# Patient Record
Sex: Female | Born: 1985 | Hispanic: Yes | Marital: Married | Smoking: Never smoker
Health system: Southern US, Community
[De-identification: ages and names within clinical notes are randomized; demographics above are authoritative.]

## PROBLEM LIST (undated history)

## (undated) DIAGNOSIS — D649 Anemia, unspecified: Secondary | ICD-10-CM

## (undated) DIAGNOSIS — Z5189 Encounter for other specified aftercare: Secondary | ICD-10-CM

## (undated) DIAGNOSIS — R87629 Unspecified abnormal cytological findings in specimens from vagina: Secondary | ICD-10-CM

## (undated) DIAGNOSIS — F329 Major depressive disorder, single episode, unspecified: Secondary | ICD-10-CM

## (undated) DIAGNOSIS — N83209 Unspecified ovarian cyst, unspecified side: Secondary | ICD-10-CM

## (undated) DIAGNOSIS — F32A Depression, unspecified: Secondary | ICD-10-CM

## (undated) HISTORY — DX: Unspecified abnormal cytological findings in specimens from vagina: R87.629

## (undated) HISTORY — DX: Anemia, unspecified: D64.9

## (undated) HISTORY — DX: Depression, unspecified: F32.A

## (undated) HISTORY — DX: Encounter for other specified aftercare: Z51.89

---
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
## 2007-10-21 LAB — HM PAP SMEAR

## 2017-09-04 ENCOUNTER — Emergency Department
Admission: EM | Admit: 2017-09-04 | Discharge: 2017-09-04 | Disposition: A | Discharge status: Home / Self Care | Payer: Medicaid Other | Attending: Emergency Medicine | Admitting: Emergency Medicine

## 2017-09-04 ENCOUNTER — Emergency Department: Payer: Medicaid Other

## 2017-09-04 DIAGNOSIS — O200 Threatened abortion: Secondary | ICD-10-CM | POA: Insufficient documentation

## 2017-09-04 DIAGNOSIS — Z3A12 12 weeks gestation of pregnancy: Secondary | ICD-10-CM | POA: Insufficient documentation

## 2017-09-04 DIAGNOSIS — O4690 Antepartum hemorrhage, unspecified, unspecified trimester: Secondary | ICD-10-CM

## 2017-09-04 DIAGNOSIS — O4691 Antepartum hemorrhage, unspecified, first trimester: Secondary | ICD-10-CM | POA: Diagnosis present

## 2017-09-04 HISTORY — DX: Unspecified ovarian cyst, unspecified side: N83.209

## 2017-09-04 LAB — ABO/RH: ABO/RH(D): B POS

## 2017-09-04 LAB — HCG, QUANTITATIVE, PREGNANCY: hCG, Beta Chain, Quant, S: 18312 m[IU]/mL — ABNORMAL HIGH

## 2017-09-04 MED ORDER — IBUPROFEN 600 MG PO TABS
600.0000 mg | ORAL_TABLET | Freq: Once | ORAL | Status: AC
  Administered 2017-09-04: 600 mg via ORAL
  Filled 2017-09-04: qty 1

## 2017-09-04 NOTE — ED Provider Notes (Signed)
-----------------------------------------   3:18 PM on 09/04/2017 -----------------------------------------   Blood pressure 118/81, pulse 84, temperature 99.1 F (37.3 C), temperature source Oral, resp. rate 16, weight 61.2 kg (135 lb), last menstrual period 06/04/2017, SpO2 100 %.  Assuming care from Dr. Cyril LoosenKinner of Oneita JollyEleticia Rubye BeachMartinez Rivas is a 32 y.o. female with a chief complaint of Vaginal Bleeding .    Please refer to H&P by previous MD for further details.  The current plan of care is to f/u results of US and ABO.   _________________________ 6:19 PM on 09/04/2017 -----------------------------------------  Interpreted by me: US: IUP measuring 6w GA with no cardiac activity concerning for non viable pregnancy   Interpretation by Radiologist:  [place] Ob Comp Less 14 Wks  Result Date: 09/04/2017 CLINICAL DATA:  Vaginal bleeding. Estimated gestational age of [redacted] weeks, 1 day by LMP. EXAM: OBSTETRIC <14 WK US AND TRANSVAGINAL OB US TECHNIQUE: Both transabdominal and transvaginal ultrasound examinations were performed for complete evaluation of the gestation as well as the maternal uterus, adnexal regions, and pelvic cul-de-sac. Transvaginal technique was performed to assess early pregnancy. COMPARISON:  None. FINDINGS: Intrauterine gestational sac: Single Yolk sac:  Not Visualized. Embryo:  Visualized. Cardiac Activity: Not Visualized. CRL: 3.2 mm   6 w   0 d                  US EDC: 04/30/2018 Subchorionic hemorrhage:  None visualized. Maternal uterus/adnexae: Unremarkable. IMPRESSION: 1. Single intrauterine gestation with crown-rump length of 3.2 mm. No definite cardiac activity. Findings are suspicious but not yet definitive for failed pregnancy. Recommend follow-up US in 10-14 days for definitive diagnosis. This recommendation follows SRU consensus guidelines: Diagnostic Criteria for Nonviable Pregnancy Early in the First Trimester. Malva Limes Engl J Med 2013; 161:0960-45; 369:1443-51. Electronically Signed   By:  Obie DredgeWilliam T Derry M.D.   On: 09/04/2017 16:27   [place] Ob Transvaginal  Result Date: 09/04/2017 CLINICAL DATA:  Vaginal bleeding. Estimated gestational age of [redacted] weeks, 1 day by LMP. EXAM: OBSTETRIC <14 WK US AND TRANSVAGINAL OB US TECHNIQUE: Both transabdominal and transvaginal ultrasound examinations were performed for complete evaluation of the gestation as well as the maternal uterus, adnexal regions, and pelvic cul-de-sac. Transvaginal technique was performed to assess early pregnancy. COMPARISON:  None. FINDINGS: Intrauterine gestational sac: Single Yolk sac:  Not Visualized. Embryo:  Visualized. Cardiac Activity: Not Visualized. CRL: 3.2 mm   6 w   0 d                  US EDC: 04/30/2018 Subchorionic hemorrhage:  None visualized. Maternal uterus/adnexae: Unremarkable. IMPRESSION: 1. Single intrauterine gestation with crown-rump length of 3.2 mm. No definite cardiac activity. Findings are suspicious but not yet definitive for failed pregnancy. Recommend follow-up US in 10-14 days for definitive diagnosis. This recommendation follows SRU consensus guidelines: Diagnostic Criteria for Nonviable Pregnancy Early in the First Trimester. Malva Limes Engl J Med 2013; 409:8119-14; 369:1443-51. Electronically Signed   By: Obie DredgeWilliam T Derry M.D.   On: 09/04/2017 16:27     ABO B+ with no indication for [place]. Patient and family updated on results of the US and recommendation for f/u in 10-14 days for repeat US.       Don PerkingVeronese, [place], MD 09/04/17 [phone]

## 2017-09-04 NOTE — ED Provider Notes (Signed)
[facility] Emergency Department Provider Note   ____________________________________________    I have reviewed the triage vital signs and the nursing notes.   HISTORY  Chief Complaint Vaginal Bleeding     HPI Donna Barrett is a 32 y.o. female who presents with vaginal bleeding.  Patient reports she is a G3 P2 with 2 prior vaginal deliveries without difficulties during her first 2 pregnancies.  Patient reports she believes she is about 3 months pregnant, has not had ultrasound her OB visit yet.  Developed small amount of vaginal bleeding yesterday.  Denies abdominal pain or cramping.  No dysuria.  No fevers or chills.  Past Medical History:  Diagnosis Date  . Ovarian cyst     There are no active problems to display for this patient.   History reviewed. No pertinent surgical history.  Prior to Admission medications   Not on File     Allergies Patient has no known allergies.  No family history on file.  Social History No smoking, no alcohol  Review of Systems  Constitutional: No fever/chills Eyes: No visual changes.  ENT: No sore throat. Cardiovascular: No palpitations Respiratory: No cough Gastrointestinal: No abdominal pain.   Genitourinary: Vaginal bleeding as above Musculoskeletal: Negative for back pain. Skin: Negative for rash. Neurological: Negative for headaches    ____________________________________________   PHYSICAL EXAM:  VITAL SIGNS: ED Triage Vitals  Enc Vitals Group     BP 09/04/17 1324 107/87     Pulse Rate 09/04/17 1324 95     Resp 09/04/17 1324 18     Temp 09/04/17 1324 99.1 F (37.3 C)     Temp Source 09/04/17 1324 Oral     SpO2 09/04/17 1324 99 %     Weight 09/04/17 1325 61.2 kg (135 lb)     Height --      Head Circumference --      Peak Flow --      Pain Score 09/04/17 1325 7     Pain Loc --      Pain Edu? --      Excl. in GC? --     Constitutional: Alert and oriented. No acute  distress.  Eyes: Conjunctivae are normal.   Nose: No congestion/rhinnorhea. Mouth/Throat: Mucous membranes are moist.    Cardiovascular: Normal rate, regular rhythm. Grossly normal heart sounds.  Good peripheral circulation. Respiratory: Normal respiratory effort.  No retractions. Gastrointestinal: Soft and nontender. No distention.   Genitourinary: deferred Musculoskeletal:  Warm and well perfused Neurologic:  Normal speech and language. No gross focal neurologic deficits are appreciated.  Skin:  Skin is warm, dry and intact. No rash noted. Psychiatric: Mood and affect are normal. Speech and behavior are normal.  ____________________________________________   LABS (all labs ordered are listed, but only abnormal results are displayed)  Labs Reviewed  HCG, QUANTITATIVE, PREGNANCY - Abnormal; Notable for the following components:      Result Value   hCG, Beta Chain, Quant, S 18,312 (*)    All other components within normal limits  CBC  ABO/RH   ____________________________________________  EKG  None ____________________________________________  RADIOLOGY  Ultrasound pending ____________________________________________   PROCEDURES  Procedure(s) performed: No  Procedures   Critical Care performed: No ____________________________________________   INITIAL IMPRESSION / ASSESSMENT AND PLAN / ED COURSE  Pertinent labs & imaging results that were available during my care of the patient were reviewed by me and considered in my medical decision making (see chart for details).  Patient  presents with vaginal bleeding, she believes she is about [redacted] weeks pregnant.  No significant abdominal cramping.  HCG is 18,000, ABO Rh pending, ultrasound pending  Will ask my colleague to follow up on abo/rh and US results    ____________________________________________   FINAL CLINICAL IMPRESSION(S) / ED DIAGNOSES  Final diagnoses:  Vaginal bleeding in pregnancy         Note:  This document was prepared using Dragon voice recognition software and may include unintentional dictation errors.    Jene EveryKinner, Wilferd Ritson, MD 09/04/17 [phone]

## 2017-09-04 NOTE — ED Triage Notes (Signed)
Interpreter present.  Pt reports having some brownish light bleeding yesterday.  Pt reports that she is approximately 3 months pregnant.  Pt states that the clinic told her to come to ER if she continued to bleed.  Pt reports that she is being seen through [facility] for [facility].  Pt denies having an ultrasound as of yet.  Pt is A&Ox4, in NAD at this time.

## 2017-09-04 NOTE — ED Notes (Signed)
Collected Lav top, sent to lab pending orders.

## 2017-09-04 NOTE — ED Notes (Signed)
Pt 3months pregnant, c/o vaginal bleeding since yesterday

## 2018-01-25 ENCOUNTER — Other Ambulatory Visit: Payer: Self-pay

## 2018-01-25 ENCOUNTER — Ambulatory Visit
Admission: EM | Admit: 2018-01-25 | Discharge: 2018-01-25 | Disposition: A | Discharge status: Home / Self Care | Payer: Self-pay | Attending: Internal Medicine | Admitting: Internal Medicine

## 2018-01-25 ENCOUNTER — Encounter: Payer: Self-pay | Admitting: Emergency Medicine

## 2018-01-25 DIAGNOSIS — R51 Headache: Secondary | ICD-10-CM

## 2018-01-25 DIAGNOSIS — R1110 Vomiting, unspecified: Secondary | ICD-10-CM

## 2018-01-25 DIAGNOSIS — R42 Dizziness and giddiness: Secondary | ICD-10-CM

## 2018-01-25 DIAGNOSIS — R519 Headache, unspecified: Secondary | ICD-10-CM

## 2018-01-25 LAB — URINALYSIS, COMPLETE (UACMP) WITH MICROSCOPIC
BILIRUBIN URINE: NEGATIVE
Glucose, UA: NEGATIVE mg/dL
HGB URINE DIPSTICK: NEGATIVE
Ketones, ur: NEGATIVE mg/dL
Leukocytes, UA: NEGATIVE
Nitrite: NEGATIVE
PH: 7 (ref 5.0–8.0)
Protein, ur: NEGATIVE mg/dL
SPECIFIC GRAVITY, URINE: 1.015 (ref 1.005–1.030)

## 2018-01-25 MED ORDER — PREDNISONE 50 MG PO TABS: 50.0000 mg | ORAL_TABLET | Freq: Every day | ORAL | 0 refills | Status: AC

## 2018-01-25 MED ORDER — ONDANSETRON HCL 4 MG PO TABS: 8.0000 mg | ORAL_TABLET | ORAL | 0 refills | Status: DC | PRN

## 2018-01-25 MED ORDER — SUMATRIPTAN SUCCINATE 6 MG/0.5ML ~~LOC~~ SOLN
6.0000 mg | Freq: Once | SUBCUTANEOUS | Status: AC
  Administered 2018-01-25: 6 mg via SUBCUTANEOUS

## 2018-01-25 MED ORDER — KETOROLAC TROMETHAMINE 60 MG/2ML IM SOLN
60.0000 mg | Freq: Once | INTRAMUSCULAR | Status: AC
  Administered 2018-01-25: 60 mg via INTRAMUSCULAR

## 2018-01-25 MED ORDER — ONDANSETRON HCL 4 MG/2ML IJ SOLN
8.0000 mg | Freq: Once | INTRAMUSCULAR | Status: AC
  Administered 2018-01-25: 8 mg via INTRAMUSCULAR

## 2018-01-25 NOTE — ED Notes (Signed)
[employer] Interpreter [phone] used during triage.

## 2018-01-25 NOTE — ED Triage Notes (Addendum)
Patient c/o vomiting, dizziness, blurry vision and HA that started about 2 hours ago.  Patient states that she did have a miscariage 3 months ago.   Patient also reports that she has 4 insect bites on her body 2 days ago.

## 2018-01-25 NOTE — Discharge Instructions (Signed)
Urine test today was negative for protein.  Injections of ketorolac (anti inflammatory/pain reliever) and sumatriptan (migraine medicine) were given at the urgent care today, for headache.  An injection of ondansetron was given for vomiting.  Prescriptions for ondansetron (for nausea), and prednisone (steroid, to try and break headache), were sent to the pharmacy.  Rest and push fluids.  Follow-up with your primary care provider to discuss whether preventive medicine or another headache management strategy would be helpful in decreasing frequency/severity of headaches.

## 2018-01-25 NOTE — ED Provider Notes (Signed)
[facility]    CSN: [id number] Arrival date & time: 01/25/18  1142     History   Chief Complaint Chief Complaint  Patient presents with  . Headache  . Dizziness  . Emesis    HPI Donna Barrett is a 32 y.o. female.   She presents today with headache that started about 3 hours ago, with vomiting.  Headache was preceded by a little bit of blurry vision and some dizziness.  She gets a bad headache about once a month, might last up to a week.  She gets lesser headaches 1-2 times a week that last less than a day.  She takes a lot of ibuprofen which helps a little bit. Current headache is frontal radiating to both temples.  Describes the pain is a strong and heavy pain. This headache is similar to previous headaches.  She rates her current headache as 9/10.   She has nausea, stomach hurts a little bit in the epigastric area.  No leg swelling.  Does have tiredness.  Reports a miscarriage 13 weeks ago, has not had postnatal care for this but did present to ED (chart review: 08/2017) Has a family history of headaches, her mother and grandmother have them.    HPI  Past Medical History:  Diagnosis Date  . Ovarian cyst     History reviewed. No pertinent surgical history.  OB History    Gravida  1   Para      Term      Preterm      AB      Living        SAB      TAB      Ectopic      Multiple      Live Births               Home Medications   Takes ibuprofen prn for headache  Family History See HPI  Social History Social History   Tobacco Use  . Smoking status: Never Smoker  . Smokeless tobacco: Never Used  Substance Use Topics  . Alcohol use: Never    Frequency: Never  . Drug use: Never     Allergies   Patient has no known allergies.   Review of Systems Review of Systems  All other systems reviewed and are negative.    Physical Exam Triage Vital Signs ED Triage Vitals  Enc Vitals Group     BP 01/25/18 1218 117/88       Pulse Rate 01/25/18 1218 83     Resp 01/25/18 1218 14     Temp 01/25/18 1218 98.1 F (36.7 C)     Temp Source 01/25/18 1218 Oral     SpO2 01/25/18 1218 100 %     Weight 01/25/18 1210 138 lb (62.6 kg)     Height 01/25/18 1210 5\' 1"  (1.549 m)     Pain Score 01/25/18 1210 8     Pain Loc --    Updated Vital Signs BP 117/88 (BP Location: Right Arm)   Pulse 83   Temp 98.1 F (36.7 C) (Oral)   Resp 14   Ht 5\' 1"  (1.549 m)   Wt 138 lb (62.6 kg)   LMP 01/12/2018 (Exact Date)   SpO2 100%   Breastfeeding? No   BMI 26.07 kg/m  Physical Exam  Constitutional: She is oriented to person, place, and time. No distress.  Alert, nicely groomed  HENT:  Head: Atraumatic.  Left TM is  moderately dull, red-tinged Right TM is translucent, no erythema Moderate nasal congestion bilaterally Tongue is midline  Eyes: Pupils are equal, round, and reactive to light.  Conjugate gaze, no eye redness/drainage  Neck: Neck supple.  Cardiovascular: Normal rate and regular rhythm.  Pulmonary/Chest: Effort normal. No respiratory distress. She has no wheezes. She exhibits no tenderness.  Lungs clear, symmetric breath sounds  Abdominal: She exhibits no distension.  Musculoskeletal: Normal range of motion. She exhibits no edema.  No leg swelling  Neurological: She is alert and oriented to person, place, and time.  Face is symmetric, speech is clear/coherent spanish language translator is used  Skin: Skin is warm and dry. No rash noted.  No cyanosis  Nursing note and vitals reviewed.    UC Treatments / Results  Labs Results for orders placed or performed during the hospital encounter of 01/25/18  Urinalysis, Complete w Microscopic  Result Value Ref Range   Color, Urine STRAW (A) YELLOW   APPearance HAZY (A) CLEAR   Specific Gravity, Urine 1.015 1.005 - 1.030   pH 7.0 5.0 - 8.0   Glucose, UA NEGATIVE NEGATIVE mg/dL   Hgb urine dipstick NEGATIVE NEGATIVE   Bilirubin Urine NEGATIVE NEGATIVE    Ketones, ur NEGATIVE NEGATIVE mg/dL   Protein, ur NEGATIVE NEGATIVE mg/dL   Nitrite NEGATIVE NEGATIVE   Leukocytes, UA NEGATIVE NEGATIVE   Squamous Epithelial / LPF 21-50 0 - 5   WBC, UA 0-5 0 - 5 WBC/hpf   RBC / HPF 0-5 0 - 5 RBC/hpf   Bacteria, UA RARE (A) NONE SEEN    EKG None  Radiology No results found.  Procedures Procedures (including critical care time)  Medications Ordered in UC Medications  ondansetron (ZOFRAN) injection 8 mg (8 mg Intramuscular Given 01/25/18 1231)  ketorolac (TORADOL) injection 60 mg (60 mg Intramuscular Given 01/25/18 1258)  SUMAtriptan (IMITREX) injection 6 mg (6 mg Subcutaneous Given 01/25/18 1255)   Headache decreased from 9/10 to 2/10 at time of discharge.    Final Clinical Impressions(s) / UC Diagnoses   Final diagnoses:  Bad headache     Discharge Instructions     Urine test today was negative for protein.  Injections of ketorolac (anti inflammatory/pain reliever) and sumatriptan (migraine medicine) were given at the urgent care today, for headache.  An injection of ondansetron was given for vomiting.  Prescriptions for ondansetron (for nausea), and prednisone (steroid, to try and break headache), were sent to the pharmacy.  Rest and push fluids.  Follow-up with your primary care provider to discuss whether preventive medicine or another headache management strategy would be helpful in decreasing frequency/severity of headaches.      ED Prescriptions    Medication Sig Dispense Auth. Provider   predniSONE (DELTASONE) 50 MG tablet Take 1 tablet (50 mg total) by mouth daily for 5 days. 5 tablet Isa RankinMurray, Ashara Lounsbury Wilson, MD   ondansetron [facility]ZOFRAN) 4 MG tablet Take 2 tablets (8 mg total) by mouth every 4 (four) hours as needed for nausea or vomiting. 20 tablet Isa RankinMurray, Corion Sherrod Wilson, MD        Isa RankinMurray, Nolen Lindamood Wilson, MD 01/29/18 [phone]

## 2018-12-16 ENCOUNTER — Other Ambulatory Visit: Payer: Self-pay | Admitting: Family Medicine

## 2018-12-16 DIAGNOSIS — N644 Mastodynia: Secondary | ICD-10-CM

## 2018-12-25 ENCOUNTER — Other Ambulatory Visit: Payer: Self-pay | Admitting: Internal Medicine

## 2018-12-25 DIAGNOSIS — N643 Galactorrhea not associated with childbirth: Secondary | ICD-10-CM

## 2018-12-25 DIAGNOSIS — N644 Mastodynia: Secondary | ICD-10-CM

## 2019-01-06 ENCOUNTER — Ambulatory Visit: Payer: Self-pay | Attending: Oncology | Admitting: *Deleted

## 2019-01-06 ENCOUNTER — Ambulatory Visit
Admission: RE | Admit: 2019-01-06 | Discharge: 2019-01-06 | Disposition: A | Discharge status: Home / Self Care | Payer: Self-pay | Source: Ambulatory Visit | Attending: Internal Medicine | Admitting: Internal Medicine

## 2019-01-06 ENCOUNTER — Other Ambulatory Visit: Payer: Self-pay

## 2019-01-06 ENCOUNTER — Ambulatory Visit
Admission: RE | Admit: 2019-01-06 | Discharge: 2019-01-06 | Disposition: A | Discharge status: Home / Self Care | Payer: Self-pay | Source: Ambulatory Visit | Attending: Family Medicine | Admitting: Family Medicine

## 2019-01-06 ENCOUNTER — Encounter: Payer: Self-pay | Admitting: *Deleted

## 2019-01-06 VITALS — BP 109/77 | HR 82 | Temp 97.7°F | Ht 63.0 in | Wt 133.8 lb

## 2019-01-06 DIAGNOSIS — N644 Mastodynia: Secondary | ICD-10-CM

## 2019-01-06 DIAGNOSIS — N643 Galactorrhea not associated with childbirth: Secondary | ICD-10-CM

## 2019-01-06 NOTE — Progress Notes (Signed)
  Subjective:     Patient ID: Donna Barrett, female   DOB: [birth year], 32 y.o.   MRN: [id number]  HPI   Review of Systems     Objective:   Physical Exam Neck:     Thyroid: Thyromegaly present.  Chest:     Breasts:        Right: Nipple discharge and tenderness present. No swelling, bleeding, inverted nipple, mass or skin change.        Left: Nipple discharge and tenderness present. No swelling, bleeding, inverted nipple, mass or skin change.    Lymphadenopathy:     Upper Body:     Right upper body: No supraclavicular or axillary adenopathy.     Left upper body: No supraclavicular or axillary adenopathy.        Assessment:     33 year old Hispanic female referred to [place] by the [facility] for bilateral breast pain and nipple discharge.  Jacqui, the interpreter present during the interview and exam.  Patient states she started have cyclic breast tenderness about 1 year ago after she had a miscarriage.  States the pain is bilateral and goes away after her menstrual cycle.  Also states she has "occassional yellow" bilateral nipple discharge on expression only.  Reviewed pathology of normal cyclic breast pain.  She was encouraged not to express any discharge.  Patient was noted to have an enlarged palpable thyroid on physical exam.  She states increased fatigue and intolerance to cold temperatures.  She was encouraged to call her primary care at the [facility] to schedule an appointment for further evaluation of her thyroid.  She is agreeable.   Patient has been screened for eligibility.  She does not have any insurance, Medicare or Medicaid.  She also meets financial eligibility.  Hand-out given on the Affordable Care Act.    Plan:     Will go ahead and get bilateral diagnostic mammogram and ultrasound through our [facility].  Will follow-up per [facility] protocol.

## 2019-01-06 NOTE — Progress Notes (Signed)
Patient with normal mammogram and ultrasound.  Since benign findings on clinical exam will defer back to primary care for any further needs.  HSIS to [place].

## 2019-03-18 ENCOUNTER — Ambulatory Visit (LOCAL_COMMUNITY_HEALTH_CENTER): Payer: Self-pay

## 2019-03-18 ENCOUNTER — Other Ambulatory Visit: Payer: Self-pay

## 2019-03-18 VITALS — BP 108/76 | Ht 62.0 in | Wt 138.0 lb

## 2019-03-18 DIAGNOSIS — Z3201 Encounter for pregnancy test, result positive: Secondary | ICD-10-CM

## 2019-03-18 LAB — PREGNANCY, URINE: Preg Test, Ur: POSITIVE — AB

## 2019-03-18 MED ORDER — PRENATAL VITAMIN 27-0.8 MG PO TABS: 1.0000 | ORAL_TABLET | Freq: Every day | ORAL | 0 refills | Status: AC

## 2019-03-18 NOTE — Progress Notes (Signed)
Pt desires prenatal care at [facility]; sent to preadmit. EGA 8.2 weeks and EDC 10/26/2019 based on normal LMP of 01/19/2019.

## 2019-04-04 NOTE — Progress Notes (Signed)
Abstraction per phone interview 04/01/19 with Tawanna Solo, RN ; Debera Lat, RN

## 2019-04-08 ENCOUNTER — Telehealth: Payer: Self-pay

## 2019-04-08 NOTE — Telephone Encounter (Signed)
Attempted to call to change appt. Due to provider out today; left message via interpreter Debera Lat, RN

## 2019-04-11 ENCOUNTER — Other Ambulatory Visit: Payer: Self-pay

## 2019-04-11 ENCOUNTER — Encounter: Payer: Self-pay | Admitting: Family Medicine

## 2019-04-11 ENCOUNTER — Ambulatory Visit: Payer: Medicaid Other | Admitting: Family Medicine

## 2019-04-11 VITALS — BP 114/79 | Temp 98.3°F | Wt 136.6 lb

## 2019-04-11 DIAGNOSIS — O26849 Uterine size-date discrepancy, unspecified trimester: Secondary | ICD-10-CM

## 2019-04-11 DIAGNOSIS — Z3480 Encounter for supervision of other normal pregnancy, unspecified trimester: Secondary | ICD-10-CM | POA: Insufficient documentation

## 2019-04-11 DIAGNOSIS — O09299 Supervision of pregnancy with other poor reproductive or obstetric history, unspecified trimester: Secondary | ICD-10-CM | POA: Diagnosis not present

## 2019-04-11 LAB — HEMOGLOBIN, FINGERSTICK: Hemoglobin: 13 g/dL (ref 11.1–15.9)

## 2019-04-11 LAB — URINALYSIS
Bilirubin, UA: NEGATIVE
Glucose, UA: NEGATIVE
Ketones, UA: NEGATIVE
Leukocytes,UA: NEGATIVE
Nitrite, UA: NEGATIVE
Protein,UA: NEGATIVE
RBC, UA: NEGATIVE
Specific Gravity, UA: 1.015 (ref 1.005–1.030)
Urobilinogen, Ur: 0.2 mg/dL (ref 0.2–1.0)
pH, UA: 7 (ref 5.0–7.5)

## 2019-04-11 LAB — OB RESULTS CONSOLE HIV ANTIBODY (ROUTINE TESTING): HIV: NONREACTIVE

## 2019-04-11 NOTE — Progress Notes (Signed)
[facility] [address] [place] [address] [place] [id number] [phone]  INITIAL PRENATAL VISIT NOTE  Subjective:  Donna Barrett is a 33 y.o. [id number] at 4519w5d being seen today to start prenatal care [facility].  She is currently monitored for the following issues for this low-risk pregnancy and has Supervision of other normal pregnancy, antepartum and History of postpartum hemorrhage, currently pregnant on their problem list.  Patient reports 1) puritic rash- started about 1 week ago. Using calomine which helps.   2) pain in lower abdomen- left side. Describes are stretching  [facility] Contractions: Not present. Vag. Bleeding: None.  Movement: Present. Denies leaking of fluid.   The following portions of the patient's history were reviewed and updated as appropriate: allergies, current medications, past family history, past medical history, past social history, past surgical history and problem list. Problem list updated.  Objective:   Vitals:   04/11/19 0848  BP: 114/79  Temp: 98.3 F (36.8 C)  Weight: 136 lb 9.6 oz (62 kg)    Fetal Status: Fetal Heart Rate (bpm): 140 Fundal Height: 14 cm Movement: Present     Physical Exam Vitals signs and nursing note reviewed.  Constitutional:      General: She is not in acute distress.    Appearance: Normal appearance. She is well-developed.  HENT:     Head: Normocephalic and atraumatic.     Mouth/Throat:     Lips: Pink.     Mouth: Mucous membranes are moist.     Dentition: Normal dentition. No dental caries.  Neck:     Thyroid: No thyroid mass or thyromegaly.  Cardiovascular:     Rate and Rhythm: Normal rate.  Pulmonary:     Effort: Pulmonary effort is normal.     Breath sounds: Normal breath sounds.  Chest:     Breasts: Breasts are symmetrical.        Right: Normal. No mass, nipple  discharge or skin change.        Left: Normal. No mass, nipple discharge or skin change.  Abdominal:     General: Abdomen is flat.     Palpations: Abdomen is soft.     Tenderness: There is no abdominal tenderness.     Comments: Gravid   Genitourinary:    General: Normal vulva.     Exam position: Lithotomy position.     Pubic Area: No rash.      Labia:        Right: No rash.        Left: No rash.      Vagina: Normal. No vaginal discharge.     Cervix: No cervical motion tenderness or friability.     Uterus: Normal. Enlarged (Gravid 14-15 size ). Not tender.      Adnexa: Right adnexa normal and left adnexa normal.     Rectum: Normal. No external hemorrhoid.  Lymphadenopathy:     Upper Body:     Right upper body: No axillary adenopathy.     Left upper body: No axillary adenopathy.  Skin:    General: Skin is warm.     Capillary Refill: Capillary refill takes less than 2 seconds.  Neurological:     Mental Status: She is alert.     Assessment and Plan:  Pregnancy: [id number] at 5519w5d  1. Supervision of other normal pregnancy, antepartum Patient was seen for IP visit  today  Counseled on model of care at [place] with CNM, FNP and MD team and delivery services provided by local OB/GYN offices at [facility] and by [place] at [facility].  Reviewed Centering pregnancy as the standard of care here at [place] currently on hold due to [place] 19  Patient received nutritional counseling and was referred to [facility] We discussed genetic screening tests and the patient desires FIRST screening/early [place] but given FH suspect more advanced GA. Ordered dating [place].   Reviewed timing of visits and standard [place] at 18-20 weeks   - HIV [place] [facility] - Urine Culture - Chlamydia/GC NAA, Confirmation - Prenatal profile without Varicella or Rubella - Hemoglobin, venipuncture  2. History of postpartum hemorrhage, currently pregnant LD aware   3. Size- dates: suspect patient is farther in gestational age.  Plan for dating [place] ASAP.   Preterm labor symptoms and general obstetric precautions including but not limited to vaginal bleeding, contractions, leaking of fluid and fetal movement were reviewed in detail with the patient.  Please refer to After Visit Summary for other counseling recommendations.   Return in about 4 weeks (around 05/09/2019) for Routine prenatal care, Telehealth/Virtual health OB Visit.  No future appointments.  Caren Macadam, MD

## 2019-04-11 NOTE — Progress Notes (Signed)
Patient here for new OB at about 11 5/7 weeks. States she went to the hospital once during this pregnancy for migraines, about 1 month ago. Patient states she had last Pap test last year at [facility], and denies any abnormal paps. Patient verifies first name as Elya.Jenetta Downer, RN

## 2019-04-11 NOTE — Progress Notes (Signed)
Hgb and urine dip reviewed, no treatment indicated. [facility] U/S referral faxed with confirmation and patient counseled to expect call from [facility] to schedule. Anatomy U/S referral on nurse to do cart to be faxed and scheduled after 1st U/S completed. Patient scheduled for next RV in clinic, per Dr. Ernestina Patches request, for 05/09/2019, patient given reminder card.Jenetta Downer, RN

## 2019-04-12 LAB — CBC/D/PLT+RPR+RH+ABO+AB SCR
Antibody Screen: NEGATIVE
Basophils Absolute: 0 10*3/uL (ref 0.0–0.2)
Basos: 0 %
EOS (ABSOLUTE): 0.2 10*3/uL (ref 0.0–0.4)
Eos: 2 %
Hematocrit: 39.4 % (ref 34.0–46.6)
Hemoglobin: 13.3 g/dL (ref 11.1–15.9)
Hepatitis B Surface Ag: NEGATIVE
Immature Grans (Abs): 0.1 10*3/uL (ref 0.0–0.1)
Immature Granulocytes: 1 %
Lymphocytes Absolute: 2.1 10*3/uL (ref 0.7–3.1)
Lymphs: 19 %
MCH: 29.9 pg (ref 26.6–33.0)
MCHC: 33.8 g/dL (ref 31.5–35.7)
MCV: 89 fL (ref 79–97)
Monocytes Absolute: 0.7 10*3/uL (ref 0.1–0.9)
Monocytes: 7 %
Neutrophils Absolute: 8 10*3/uL — ABNORMAL HIGH (ref 1.4–7.0)
Neutrophils: 71 %
Platelets: 220 10*3/uL (ref 150–450)
RBC: 4.45 x10E6/uL (ref 3.77–5.28)
RDW: 14.4 % (ref 11.7–15.4)
RPR Ser Ql: NONREACTIVE
Rh Factor: POSITIVE
WBC: 11.1 10*3/uL — ABNORMAL HIGH (ref 3.4–10.8)

## 2019-04-13 LAB — URINE CULTURE

## 2019-04-15 LAB — CHLAMYDIA/GC NAA, CONFIRMATION
Chlamydia trachomatis, NAA: NEGATIVE
Neisseria gonorrhoeae, NAA: NEGATIVE

## 2019-04-22 ENCOUNTER — Telehealth: Payer: Self-pay

## 2019-04-22 NOTE — Telephone Encounter (Signed)
Returned call-informed of above via clerical Debera Lat, Therapist, sports

## 2019-04-22 NOTE — Telephone Encounter (Signed)
Per [facility], name of Donna Barrett, Donna Barrett (DOB 1986/07/26, same address and phone # as in [place]) has MRN = [id number].   Client also in [facility] as Donna Barrett, Donna Barrett (DOB and phone # same as at [place]) and  MRN = [id number]. This name is where can find  04/18/2019 [place] report today.  Client has [facility] anatomy US referral with name of Donna Barrett, Donna Barrett. Per Altha Harm in [facility] OB US scheduling, she will merge client charts at [facility] to name as appears on [facility] and MRN = [id number].  Phone call to client to notify her of above and that [facility] will be calling her with [place] appt. [facility] with number to call provided. Language Line used (ID # [phone]).  Rich Number, RN

## 2019-05-09 ENCOUNTER — Other Ambulatory Visit: Payer: Self-pay

## 2019-05-09 ENCOUNTER — Ambulatory Visit: Payer: Medicaid Other | Admitting: Advanced Practice Midwife

## 2019-05-09 DIAGNOSIS — Z3480 Encounter for supervision of other normal pregnancy, unspecified trimester: Secondary | ICD-10-CM

## 2019-05-09 NOTE — Progress Notes (Signed)
   PRENATAL VISIT NOTE  Subjective:  Donna Barrett is a 33 y.o. [phone] at [redacted]w[redacted]d being seen today for ongoing prenatal care.  She is currently monitored for the following issues for this low-risk pregnancy and has Supervision of other normal pregnancy, antepartum and History of postpartum hemorrhage, currently pregnant on their problem list.  Patient reports no complaints.  Contractions: Not present. Vag. Bleeding: None.  Movement: Present. Denies leaking of fluid/ROM.   The following portions of the patient's history were reviewed and updated as appropriate: allergies, current medications, past family history, past medical history, past social history, past surgical history and problem list. Problem list updated.  Objective:   Vitals:   05/09/19 1513  BP: 106/68  Temp: 97.8 F (36.6 C)  Weight: 142 lb 12.8 oz (64.8 kg)    Fetal Status: Fetal Heart Rate (bpm): 130 Fundal Height: 17 cm Movement: Present     General:  Alert, oriented and cooperative. Patient is in no acute distress.  Skin: Skin is warm and dry. No rash noted.   Cardiovascular: Normal heart rate noted  Respiratory: Normal respiratory effort, no problems with respiration noted  Abdomen: Soft, gravid, appropriate for gestational age.  Pain/Pressure: Present     Pelvic: Cervical exam deferred        Extremities: Normal range of motion.  Edema: None  Mental Status: Normal mood and affect. Normal behavior. Normal judgment and thought content.   Assessment and Plan:  Pregnancy: [id number] at [redacted]w[redacted]d  1. Supervision of other normal pregnancy, antepartum Desires Quad screen.  Declines flu vax today.  Not employed.  Pt reminded of 05/30/19 anatomy scan Needs ROI for pap 2019 [place] Only   Preterm labor symptoms and general obstetric precautions including but not limited to vaginal bleeding, contractions, leaking of fluid and fetal movement were reviewed in detail with the patient. Please  refer to After Visit Summary for other counseling recommendations.  No follow-ups on file.  No future appointments.  Herbie Saxon, CNM

## 2019-05-09 NOTE — Progress Notes (Signed)
Patient here for prenatal visit at 17 5/7. Patient aware of [facility] U/S on 05/30/2019. Patient wants Quad screen today. Patient unsure about flu vaccine and wants to think about it for next visit.Jenetta Downer, RN

## 2019-06-06 ENCOUNTER — Ambulatory Visit: Payer: Self-pay | Admitting: Family Medicine

## 2019-06-06 ENCOUNTER — Other Ambulatory Visit: Payer: Self-pay

## 2019-06-06 VITALS — BP 101/65 | Temp 98.7°F | Wt 146.4 lb

## 2019-06-06 DIAGNOSIS — Z3480 Encounter for supervision of other normal pregnancy, unspecified trimester: Secondary | ICD-10-CM

## 2019-06-06 DIAGNOSIS — O09299 Supervision of pregnancy with other poor reproductive or obstetric history, unspecified trimester: Secondary | ICD-10-CM

## 2019-06-06 DIAGNOSIS — Z23 Encounter for immunization: Secondary | ICD-10-CM

## 2019-06-06 NOTE — Progress Notes (Signed)
Patient here for MH RV at 33 5/7. Patient desires flu vaccine today. States she has had a flu shot before and had a "little fever, a little flu, and a little headache". Jenetta Downer, RN

## 2019-06-06 NOTE — Progress Notes (Signed)
Flu vaccine given today, right deltoid, tolerated well, VIS given. Jenetta Downer, RN

## 2019-06-10 NOTE — Progress Notes (Signed)
   PRENATAL VISIT NOTE  Subjective:  Donna Barrett is a 33 y.o. [phone] at [redacted]w[redacted]d being seen today for ongoing prenatal care.  She is currently monitored for the following issues for this low-risk pregnancy and has Supervision of other normal pregnancy, antepartum and History of postpartum hemorrhage, currently pregnant on their problem list.  Patient reports no complaints.  Contractions: Not present. Vag. Bleeding: None.  Movement: Present. Denies leaking of fluid/ROM.   The following portions of the patient's history were reviewed and updated as appropriate: allergies, current medications, past family history, past medical history, past social history, past surgical history and problem list. Problem list updated.  Objective:   Vitals:   06/06/19 1617  BP: 101/65  Temp: 98.7 F (37.1 C)  Weight: 146 lb 6.4 oz (66.4 kg)    Fetal Status: Fetal Heart Rate (bpm): 150 Fundal Height: 20 cm Movement: Present     General:  Alert, oriented and cooperative. Patient is in no acute distress.  Skin: Skin is warm and dry. No rash noted.   Cardiovascular: Normal heart rate noted  Respiratory: Normal respiratory effort, no problems with respiration noted  Abdomen: Soft, gravid, appropriate for gestational age.  Pain/Pressure: Present     Pelvic: Cervical exam deferred        Extremities: Normal range of motion.  Edema: None  Mental Status: Normal mood and affect. Normal behavior. Normal judgment and thought content.   Assessment and Plan:  Pregnancy: [id number] at [redacted]w[redacted]d  1. History of postpartum hemorrhage, currently pregnant LD aware  2. Flu vaccine need - Flu Vaccine QUAD 36+ mos IM  3. Supervision of other normal pregnancy, antepartum Up to date, had [place] 10/30 QUAD today   Preterm labor symptoms and general obstetric precautions including but not limited to vaginal bleeding, contractions, leaking of fluid and fetal movement were reviewed in detail with the patient. Please refer  to After Visit Summary for other counseling recommendations.  Return in about 4 weeks (around 07/04/2019) for Routine prenatal care, in person.  Future Appointments  Date Time Provider [place]  07/04/2019  3:20 PM AC-[facility] PROVIDER AC-[facility] None    Caren Macadam, MD

## 2019-07-04 ENCOUNTER — Other Ambulatory Visit: Payer: Self-pay

## 2019-07-04 ENCOUNTER — Ambulatory Visit: Payer: Self-pay

## 2019-07-04 ENCOUNTER — Ambulatory Visit: Payer: Self-pay | Admitting: Advanced Practice Midwife

## 2019-07-04 DIAGNOSIS — O09299 Supervision of pregnancy with other poor reproductive or obstetric history, unspecified trimester: Secondary | ICD-10-CM

## 2019-07-04 DIAGNOSIS — Z3480 Encounter for supervision of other normal pregnancy, unspecified trimester: Secondary | ICD-10-CM

## 2019-07-04 MED ORDER — PRENATAL VITAMIN 27-0.8 MG PO TABS
1.0000 | ORAL_TABLET | Freq: Every day | ORAL | 0 refills | Status: DC
Start: 2019-07-04 — End: 2019-08-06

## 2019-07-04 NOTE — Progress Notes (Signed)
   PRENATAL VISIT NOTE  Subjective:  Donna Barrett is a 33 y.o. [phone] at [redacted]w[redacted]d being seen today for ongoing prenatal care.  She is currently monitored for the following issues for this low-risk pregnancy and has Supervision of other normal pregnancy, antepartum and History of postpartum hemorrhage, currently pregnant on their problem list.  Patient reports no complaints.  Contractions: Not present. Vag. Bleeding: None.  Movement: Present. Denies leaking of fluid/ROM.   The following portions of the patient's history were reviewed and updated as appropriate: allergies, current medications, past family history, past medical history, past social history, past surgical history and problem list. Problem list updated.  Objective:  There were no vitals filed for this visit.  Fetal Status: Fetal Heart Rate (bpm): 160 Fundal Height: 25 cm Movement: Present     General:  Alert, oriented and cooperative. Patient is in no acute distress.  Skin: Skin is warm and dry. No rash noted.   Cardiovascular: Normal heart rate noted  Respiratory: Normal respiratory effort, no problems with respiration noted  Abdomen: Soft, gravid, appropriate for gestational age.  Pain/Pressure: Present     Pelvic: Cervical exam deferred        Extremities: Normal range of motion.  Edema: None  Mental Status: Normal mood and affect. Normal behavior. Normal judgment and thought content.   Assessment and Plan:  Pregnancy: [id number] at [redacted]w[redacted]d   1. History of postpartum hemorrhage, currently pregnant - Prenatal Vit-Fe Fumarate-FA (PRENATAL VITAMIN) 27-0.8 MG TABS; Take 1 tablet by mouth daily.  Dispense: 100 tablet; Refill: 0  2. Supervision of other normal pregnancy, antepartum States had 3D u/s done for fun.  Reviewed 05/30/19 u/s at 18 6/7 with 3 VC, normal anatomy, posterior placenta.  Reviewed 04/18/19 u/s at 12 4/7 with EDC 10/26/19   Preterm labor symptoms and general obstetric precautions including but not  limited to vaginal bleeding, contractions, leaking of fluid and fetal movement were reviewed in detail with the patient. Please refer to After Visit Summary for other counseling recommendations.  Return in about 4 weeks (around 08/01/2019) for routine PNC.  Future Appointments  Date Time Provider [place]  07/31/2019  3:20 PM AC-[facility] PROVIDER AC-[facility] None    Herbie Saxon, CNM

## 2019-07-04 NOTE — Progress Notes (Signed)
Patient here for MH RV at 23 5/7. CCNC and PHQ9 today. S/S PTL reviewed and literature given.Jenetta Downer, RN

## 2019-07-04 NOTE — Progress Notes (Signed)
PNV given today.Yessica Putnam Brewer-Jensen, RN 

## 2019-07-31 ENCOUNTER — Ambulatory Visit: Payer: Self-pay

## 2019-08-04 ENCOUNTER — Other Ambulatory Visit: Payer: Self-pay

## 2019-08-04 ENCOUNTER — Ambulatory Visit: Payer: Self-pay | Admitting: Family Medicine

## 2019-08-04 ENCOUNTER — Encounter: Payer: Self-pay | Admitting: Family Medicine

## 2019-08-04 DIAGNOSIS — O09299 Supervision of pregnancy with other poor reproductive or obstetric history, unspecified trimester: Secondary | ICD-10-CM

## 2019-08-04 DIAGNOSIS — Z3480 Encounter for supervision of other normal pregnancy, unspecified trimester: Secondary | ICD-10-CM

## 2019-08-04 NOTE — Progress Notes (Signed)
Pt to return to clinic in 2 days for 28 week labs, Tdap, and evaluation. Provider orders completed.

## 2019-08-04 NOTE — Progress Notes (Signed)
  PRENATAL VISIT NOTE  Subjective:  Donna Barrett is a 34 y.o. [phone] at [redacted]w[redacted]d being seen today for ongoing prenatal care.  She is currently monitored for the following issues for this low-risk pregnancy and has Supervision of other normal pregnancy, antepartum and History of postpartum hemorrhage, currently pregnant on their problem list.    Contractions: Not present. Vag. Bleeding: None.  Movement: Present. Denies leaking of fluid/ROM.   Pt reports she is not feeling well just now. She felt well this morning but upon arriving has felt dizzy, heart racing. She was seen in ER 2 wks ago for dental pain, states this has gotten much better. No known covid contacts, denies attending large gatherings.  The following portions of the patient's history were reviewed and updated as appropriate: allergies, current medications, past family history, past medical history, past social history, past surgical history and problem list. Problem list updated.  Objective:   Vitals:   08/04/19 0827 08/04/19 0908  BP: (!) 94/58 107/70  Pulse: (!) 134 100  Temp: (!) 97.5 F (36.4 C)   Weight: 153 lb 9.6 oz (69.7 kg)    Po2 while sitting and walking: 99% Pulse while walking: 120   Fetal Status: Fetal Heart Rate (bpm): 160 Fundal Height: 28 cm Movement: Present     General:  Alert, oriented and cooperative. Patient is in no acute distress, though is diaphoretic. Moves slowly to and from exam table.  Skin: Skin is warm and dry. No rash noted. Dentition: L upper tooth with darkened area, very faint mild surrounding erythema, no tenderness or surrounding fluctuance.  Cardiovascular: Normal heart rate noted  Respiratory: Normal respiratory effort, no problems with respiration noted  Abdomen: Soft, gravid, appropriate for gestational age.  +R mid abdomen tenderness  Pelvic: Cervical exam deferred        Extremities: Normal range of motion.  Edema: None  Mental Status: Normal mood and affect. Normal  behavior. Normal judgment and thought content.   Assessment and Plan:  Pregnancy: [id number] at [redacted]w[redacted]d  1. Supervision of other normal pregnancy, antepartum Pt is diaphoretic, not feeling well, abdomen tender. After sitting for 10 minutes states she is feeling somewhat better but pulse continues elevated with walking slowly and she is not yet back to feeling normal. Spoke with Dr. Alvester Morin and with [facility] provider on call and will send today for evaluation before proceeding with 28 wk visit/labs. She has a ride that will come pick her up now, she is comfortable with this plan.  2. History of postpartum hemorrhage, currently pregnant    Preterm labor symptoms and general obstetric precautions including but not limited to vaginal bleeding, contractions, leaking of fluid and fetal movement were reviewed in detail with the patient. Please refer to After Visit Summary for other counseling recommendations.  Return in about 2 days (around 08/06/2019) for routine prenatal care.  No future appointments.  Ann Held, PA-C

## 2019-08-04 NOTE — Progress Notes (Signed)
Pt to clinic, due for 28 week labs. RN noticed mark difference in pt from time pt walked into clinic until ready for provider. Pt started c/o of dizziness and face feeling cold and overall not feeling well, had to remove mask several times. Pulse repeated 110, then 97. Oxygen saturation 98% room air. Temp repeated 98.4 degrees Farenheit, oral. RN reported findings to Samara Snide, PA. Did not administer Tdap and did not provide 1 hour glucose drink.

## 2019-08-06 ENCOUNTER — Ambulatory Visit: Payer: Self-pay | Admitting: Family Medicine

## 2019-08-06 ENCOUNTER — Other Ambulatory Visit: Payer: Self-pay

## 2019-08-06 ENCOUNTER — Encounter: Payer: Self-pay | Admitting: Family Medicine

## 2019-08-06 VITALS — BP 107/75 | HR 107 | Temp 97.9°F | Wt 154.6 lb

## 2019-08-06 DIAGNOSIS — R55 Syncope and collapse: Secondary | ICD-10-CM | POA: Insufficient documentation

## 2019-08-06 DIAGNOSIS — Z3480 Encounter for supervision of other normal pregnancy, unspecified trimester: Secondary | ICD-10-CM

## 2019-08-06 DIAGNOSIS — Z23 Encounter for immunization: Secondary | ICD-10-CM

## 2019-08-06 LAB — OB RESULTS CONSOLE HIV ANTIBODY (ROUTINE TESTING): HIV: NONREACTIVE

## 2019-08-06 MED ORDER — PRENATAL VITAMIN 27-0.8 MG PO TABS: 1.0000 | ORAL_TABLET | Freq: Every day | ORAL | 0 refills | Status: DC

## 2019-08-06 NOTE — Progress Notes (Signed)
   PRENATAL VISIT NOTE  Subjective:  Donna Barrett is a 34 y.o. [phone] at [redacted]w[redacted]d being seen today for ongoing prenatal care.  She is currently monitored for the following issues for this low-risk pregnancy and has Supervision of other normal pregnancy, antepartum; History of postpartum hemorrhage, currently pregnant; and Pre-syncope on their problem list.   Contractions: Not present. Vag. Bleeding: None.  Movement: Present. Denies leaking of fluid/ROM.   [facility] ago after presyncopal episode here in clinic. Labs and EKG were wnl. Cardiac monitor was placed for 2 wks. States she may have had another episode yesterday, though wasn't very intense. Denies fainting. States she is feeling well today.   The following portions of the patient's history were reviewed and updated as appropriate: allergies, current medications, past family history, past medical history, past social history, past surgical history and problem list. Problem list updated.  Objective:   Vitals:   08/06/19 1601  BP: 107/75  Pulse: (!) 107  Temp: 97.9 F (36.6 C)  Weight: 154 lb 9.6 oz (70.1 kg)    Fetal Status: Fetal Heart Rate (bpm): 150 Fundal Height: 28 cm Movement: Present     General:  Alert, oriented and cooperative. Patient is in no acute distress.  Skin: Skin is warm and dry. No rash noted.   Cardiovascular: Normal heart rate and rhythm, no murmurs.  Respiratory: Normal respiratory effort, no problems with respiration noted  Abdomen: Soft, gravid, appropriate for gestational age.  Pain/Pressure: Absent     Pelvic: Cervical exam deferred        Extremities: Normal range of motion.  Edema: None  Mental Status: Normal mood and affect. Normal behavior. Normal judgment and thought content.   Assessment and Plan:  Pregnancy: [id number] at [redacted]w[redacted]d   1. Supervision of other normal pregnancy, antepartum Up to date. 28 wk labs today.  - HIV [place] [facility] - Glucose, 1 hour  gestational - RPR - Prenatal Vit-Fe Fumarate-FA (PRENATAL VITAMIN) 27-0.8 MG TABS; Take 1 tablet by mouth daily.  Dispense: 100 tablet; Refill: 0 - Tdap vaccine greater than or equal to 7yo IM - Hemoglobin, venipuncture  2. Pre-syncope Unclear etiology s/p [facility] ER evaluation 08/04/19 - labs and EKG wnl. She has a cardiac monitor on x 2wks, knows precautions for when to return to ER.   3. Need for diphtheria-tetanus-pertussis (Tdap) vaccine - Tdap vaccine greater than or equal to 7yo IM    Preterm labor symptoms and general obstetric precautions including but not limited to vaginal bleeding, contractions, leaking of fluid and fetal movement were reviewed in detail with the patient. Please refer to After Visit Summary for other counseling recommendations.  Return in about 2 weeks (around 08/20/2019) for routine prenatal care.  Future Appointments  Date Time Provider Department Center  08/20/2019  4:00 PM AC-MH PROVIDER AC-MAT None    Ann Held, PA-C

## 2019-08-06 NOTE — Progress Notes (Signed)
In for visit; 08/04/19 [facility] notes on chart; taking PNV; 28 wk. Labs today & agrees to Tdap; undecided on [facility] Sharlette Dense, [employer]

## 2019-08-07 ENCOUNTER — Telehealth: Payer: Self-pay

## 2019-08-07 DIAGNOSIS — O99810 Abnormal glucose complicating pregnancy: Secondary | ICD-10-CM | POA: Insufficient documentation

## 2019-08-07 LAB — RPR: RPR Ser Ql: NONREACTIVE

## 2019-08-07 LAB — HEMOGLOBIN, FINGERSTICK: Hemoglobin: 11.9 g/dL (ref 11.1–15.9)

## 2019-08-07 LAB — GLUCOSE, 1 HOUR GESTATIONAL: Gestational Diabetes Screen: 143 mg/dL — ABNORMAL HIGH (ref 65–139)

## 2019-08-07 NOTE — Telephone Encounter (Signed)
Call to client via interpreter V. Olmedo-discussed abnl glucose test and scheduled 3 Hr Gtt 08/08/19.  Instructions given Sharlette Dense, RN

## 2019-08-08 ENCOUNTER — Other Ambulatory Visit: Payer: Self-pay

## 2019-08-08 DIAGNOSIS — O99810 Abnormal glucose complicating pregnancy: Secondary | ICD-10-CM

## 2019-08-08 NOTE — Progress Notes (Signed)
Pt states the last time she had anything to eat or drink other than small sips of water was around 7:30 PM last night. Pt given instructions for 3 hr GTT lab and confirmed pt had times to return to lab for blood draw.

## 2019-08-09 LAB — GLUCOSE TOLERANCE TEST, 6 HOUR
Glucose, 1 Hour GTT: 172 mg/dL (ref 65–199)
Glucose, 2 hour: 152 mg/dL — ABNORMAL HIGH (ref 65–139)
Glucose, 3 hour: 90 mg/dL (ref 65–109)
Glucose, GTT - Fasting: 81 mg/dL (ref 65–99)

## 2019-08-11 ENCOUNTER — Telehealth: Payer: Self-pay

## 2019-08-11 NOTE — Telephone Encounter (Signed)
Call to client via interpreter M. Noway-left voicemail message informing 3 Hr Gtt WNL; to call with questions Sharlette Dense, RN

## 2019-08-20 ENCOUNTER — Ambulatory Visit: Payer: Self-pay

## 2019-08-20 ENCOUNTER — Ambulatory Visit: Payer: Self-pay | Admitting: Advanced Practice Midwife

## 2019-08-20 ENCOUNTER — Other Ambulatory Visit: Payer: Self-pay

## 2019-08-20 DIAGNOSIS — Z3480 Encounter for supervision of other normal pregnancy, unspecified trimester: Secondary | ICD-10-CM

## 2019-08-20 DIAGNOSIS — O09299 Supervision of pregnancy with other poor reproductive or obstetric history, unspecified trimester: Secondary | ICD-10-CM

## 2019-08-20 DIAGNOSIS — O99810 Abnormal glucose complicating pregnancy: Secondary | ICD-10-CM

## 2019-08-20 NOTE — Progress Notes (Signed)
   PRENATAL VISIT NOTE  Subjective:  Donna Barrett is a 34 y.o. [phone] at [redacted]w[redacted]d being seen today for ongoing prenatal care.  She is currently monitored for the following issues for this low-risk pregnancy and has Supervision of other normal pregnancy, antepartum; History of postpartum hemorrhage, currently pregnant; Pre-syncope; and Abnormal 1 hr GTT, 3 hr GTT wnl on their problem list.  Patient reports no complaints.  Contractions: Not present. Vag. Bleeding: None.  Movement: Present. Denies leaking of fluid/ROM.   The following portions of the patient's history were reviewed and updated as appropriate: allergies, current medications, past family history, past medical history, past social history, past surgical history and problem list. Problem list updated.  Objective:   Vitals:   08/20/19 1612  BP: 104/66  Pulse: (!) 104  Temp: 98.1 F (36.7 C)  Weight: 157 lb 9.6 oz (71.5 kg)    Fetal Status: Fetal Heart Rate (bpm): 140 Fundal Height: 31 cm Movement: Present     General:  Alert, oriented and cooperative. Patient is in no acute distress.  Skin: Skin is warm and dry. No rash noted.   Cardiovascular: Normal heart rate noted  Respiratory: Normal respiratory effort, no problems with respiration noted  Abdomen: Soft, gravid, appropriate for gestational age.  Pain/Pressure: Absent     Pelvic: Cervical exam deferred        Extremities: Normal range of motion.  Edema: None  Mental Status: Normal mood and affect. Normal behavior. Normal judgment and thought content.   Assessment and Plan:  Pregnancy: [id number] at [redacted]w[redacted]d   1. Supervision of other normal pregnancy, antepartum Not working.  Wants condoms only for birth control pp. Baby's name is Jaclynn Guarneri   Preterm labor symptoms and general obstetric precautions including but not limited to vaginal bleeding, contractions, leaking of fluid and fetal movement were reviewed in detail with the patient. Please refer to After Visit  Summary for other counseling recommendations.  No follow-ups on file.  Future Appointments  Date Time Provider Department Center  09/03/2019  4:00 PM AC-MH PROVIDER AC-MAT None    Alberteen Spindle, CNM

## 2019-08-20 NOTE — Progress Notes (Signed)
In for visit; taking PNV; denies hospital visits since last seen; desires condoms only for [facility] Sharlette Dense, RN

## 2019-09-03 ENCOUNTER — Ambulatory Visit: Payer: Self-pay | Admitting: Advanced Practice Midwife

## 2019-09-03 ENCOUNTER — Other Ambulatory Visit: Payer: Self-pay

## 2019-09-03 VITALS — BP 107/71 | HR 128 | Temp 98.4°F | Wt 159.6 lb

## 2019-09-03 DIAGNOSIS — O99810 Abnormal glucose complicating pregnancy: Secondary | ICD-10-CM

## 2019-09-03 DIAGNOSIS — Z3480 Encounter for supervision of other normal pregnancy, unspecified trimester: Secondary | ICD-10-CM

## 2019-09-03 DIAGNOSIS — O09299 Supervision of pregnancy with other poor reproductive or obstetric history, unspecified trimester: Secondary | ICD-10-CM

## 2019-09-03 NOTE — Progress Notes (Signed)
Patient here for MH RV at 32 3/7. Kick counts reviewed and cards given.Burt Knack, RN

## 2019-09-03 NOTE — Progress Notes (Signed)
   PRENATAL VISIT NOTE  Subjective:  Donna Barrett is a 34 y.o. [phone] at [redacted]w[redacted]d being seen today for ongoing prenatal care.  She is currently monitored for the following issues for this low-risk pregnancy and has Supervision of other normal pregnancy, antepartum; History of postpartum hemorrhage, currently pregnant; Pre-syncope; and Abnormal 1 hr GTT, 3 hr GTT wnl on their problem list.  Patient reports numerous pregnancy miseries.  Contractions: Not present. Vag. Bleeding: None.  Movement: Present. Denies leaking of fluid/ROM.   The following portions of the patient's history were reviewed and updated as appropriate: allergies, current medications, past family history, past medical history, past social history, past surgical history and problem list. Problem list updated.  Objective:   Vitals:   09/03/19 1556  BP: 107/71  Pulse: (!) 128  Temp: 98.4 F (36.9 C)  Weight: 159 lb 9.6 oz (72.4 kg)    Fetal Status: Fetal Heart Rate (bpm): 140 Fundal Height: 33 cm Movement: Present     General:  Alert, oriented and cooperative. Patient is in no acute distress.  Skin: Skin is warm and dry. No rash noted.   Cardiovascular: Normal heart rate noted  Respiratory: Normal respiratory effort, no problems with respiration noted  Abdomen: Soft, gravid, appropriate for gestational age.  Pain/Pressure: Absent     Pelvic: Cervical exam deferred        Extremities: Normal range of motion.  Edema: None  Mental Status: Normal mood and affect. Normal behavior. Normal judgment and thought content.   Assessment and Plan:  Pregnancy: [id number] at [redacted]w[redacted]d  1. Abnormal glucose tolerance test (GTT) during pregnancy, antepartum wnl  2. Supervision of other normal pregnancy, antepartum C/o leg cramps, pelvic "heaviness", right nerve compression in buttock, fatigue due to difficulty sleeping because of baby moving--suggestions given  Preterm labor symptoms and general obstetric precautions including  but not limited to vaginal bleeding, contractions, leaking of fluid and fetal movement were reviewed in detail with the patient. Please refer to After Visit Summary for other counseling recommendations.  Return in about 2 weeks (around 09/17/2019) for routine PNC.  Future Appointments  Date Time Provider Department Center  09/17/2019  4:00 PM AC-MH PROVIDER AC-MAT None    Alberteen Spindle, CNM

## 2019-09-17 ENCOUNTER — Other Ambulatory Visit: Payer: Self-pay

## 2019-09-17 ENCOUNTER — Ambulatory Visit: Payer: Self-pay | Admitting: Advanced Practice Midwife

## 2019-09-17 VITALS — BP 106/72 | HR 107 | Temp 97.3°F | Wt 161.6 lb

## 2019-09-17 DIAGNOSIS — Z3480 Encounter for supervision of other normal pregnancy, unspecified trimester: Secondary | ICD-10-CM

## 2019-09-17 NOTE — Progress Notes (Signed)
Here today for 34.3 week MH RV. Taking PNV QD. Denies ED/hospital visits since last RV. Lorry Anastasi, RN  

## 2019-09-17 NOTE — Progress Notes (Signed)
   PRENATAL VISIT NOTE  Subjective:  Donna Barrett is a 34 y.o. [phone] at [redacted]w[redacted]d being seen today for ongoing prenatal care.  She is currently monitored for the following issues for this low-risk pregnancy and has Supervision of other normal pregnancy, antepartum; History of postpartum hemorrhage, currently pregnant; Pre-syncope; and Abnormal 1 hr GTT, 3 hr GTT wnl 08/08/19 on their problem list.  Patient reports right sciatica and difficulty sleeping due to active baby.  Contractions: Not present. Vag. Bleeding: None.  Movement: Present. Denies leaking of fluid/ROM.   The following portions of the patient's history were reviewed and updated as appropriate: allergies, current medications, past family history, past medical history, past social history, past surgical history and problem list. Problem list updated.  Objective:   Vitals:   09/17/19 1558  BP: 106/72  Pulse: (!) 107  Temp: (!) 97.3 F (36.3 C)  Weight: 161 lb 9.6 oz (73.3 kg)    Fetal Status: Fetal Heart Rate (bpm): 150 Fundal Height: 36 cm Movement: Present     General:  Alert, oriented and cooperative. Patient is in no acute distress.  Skin: Skin is warm and dry. No rash noted.   Cardiovascular: Normal heart rate noted  Respiratory: Normal respiratory effort, no problems with respiration noted  Abdomen: Soft, gravid, appropriate for gestational age.  Pain/Pressure: Absent     Pelvic: Cervical exam deferred        Extremities: Normal range of motion.  Edema: None  Mental Status: Normal mood and affect. Normal behavior. Normal judgment and thought content.   Assessment and Plan:  Pregnancy: [id number] at [redacted]w[redacted]d  1. Supervision of other normal pregnancy, antepartum Still c/o right sciatica and difficulty sleeping due to active baby--suggestions given   Preterm labor symptoms and general obstetric precautions including but not limited to vaginal bleeding, contractions, leaking of fluid and fetal movement were  reviewed in detail with the patient. Please refer to After Visit Summary for other counseling recommendations.  No follow-ups on file.  Future Appointments  Date Time Provider Department Center  10/01/2019  4:00 PM AC-MH PROVIDER AC-[facility] None    Alberteen Spindle, CNM

## 2019-10-01 ENCOUNTER — Other Ambulatory Visit: Payer: Self-pay

## 2019-10-01 ENCOUNTER — Ambulatory Visit: Payer: Self-pay | Admitting: Advanced Practice Midwife

## 2019-10-01 VITALS — BP 107/73 | HR 99 | Temp 98.4°F | Wt 161.2 lb

## 2019-10-01 DIAGNOSIS — O99810 Abnormal glucose complicating pregnancy: Secondary | ICD-10-CM

## 2019-10-01 DIAGNOSIS — Z3480 Encounter for supervision of other normal pregnancy, unspecified trimester: Secondary | ICD-10-CM

## 2019-10-01 DIAGNOSIS — O09299 Supervision of pregnancy with other poor reproductive or obstetric history, unspecified trimester: Secondary | ICD-10-CM

## 2019-10-01 MED ORDER — PRENATAL VITAMIN 27-0.8 MG PO TABS: 1.0000 | ORAL_TABLET | Freq: Every day | ORAL | 0 refills | Status: AC

## 2019-10-01 NOTE — Progress Notes (Signed)
In for visit; denies hospital visits since last appt; self collection of cultures today Sharlette Dense, RN

## 2019-10-01 NOTE — Progress Notes (Signed)
   PRENATAL VISIT NOTE  Subjective:  Donna Barrett is a 34 y.o. [phone] at [redacted]w[redacted]d being seen today for ongoing prenatal care.  She is currently monitored for the following issues for this low-risk pregnancy and has Supervision of other normal pregnancy, antepartum; History of postpartum hemorrhage, currently pregnant; Pre-syncope; and Abnormal 1 hr GTT, 3 hr GTT wnl 08/08/19 on their problem list.  Patient reports no complaints.  Contractions: Not present. Vag. Bleeding: None.  Movement: Present. Denies leaking of fluid/ROM.   The following portions of the patient's history were reviewed and updated as appropriate: allergies, current medications, past family history, past medical history, past social history, past surgical history and problem list. Problem list updated.  Objective:   Vitals:   10/01/19 1558  BP: 107/73  Pulse: 99  Temp: 98.4 F (36.9 C)  Weight: 161 lb 3.2 oz (73.1 kg)    Fetal Status: Fetal Heart Rate (bpm): 150 Fundal Height: 38 cm Movement: Present     General:  Alert, oriented and cooperative. Patient is in no acute distress.  Skin: Skin is warm and dry. No rash noted.   Cardiovascular: Normal heart rate noted  Respiratory: Normal respiratory effort, no problems with respiration noted  Abdomen: Soft, gravid, appropriate for gestational age.  Pain/Pressure: Absent     Pelvic: Cervical exam deferred        Extremities: Normal range of motion.  Edema: None  Mental Status: Normal mood and affect. Normal behavior. Normal judgment and thought content.   Assessment and Plan:  Pregnancy: [id number] at [redacted]w[redacted]d  1. Abnormal glucose tolerance test (GTT) during pregnancy, antepartum 3 hr GTT 08/08/19  2. History of postpartum hemorrhage, currently pregnant  3. Supervision of other normal pregnancy, antepartum Self collected GBS/GC/Chlamydia cultures.  Feels well.  Knows when to go to L&D. Has car seat.  Not working. - GBS Culture - Chlamydia/GC NAA, Confirmation -  Prenatal Vit-Fe Fumarate-FA (PRENATAL VITAMIN) 27-0.8 MG TABS; Take 1 tablet by mouth daily.  Dispense: 100 tablet; Refill: 0   Preterm labor symptoms and general obstetric precautions including but not limited to vaginal bleeding, contractions, leaking of fluid and fetal movement were reviewed in detail with the patient. Please refer to After Visit Summary for other counseling recommendations.  No follow-ups on file.  No future appointments.  Alberteen Spindle, CNM

## 2019-10-04 LAB — CHLAMYDIA/GC NAA, CONFIRMATION
Chlamydia trachomatis, NAA: NEGATIVE
Neisseria gonorrhoeae, NAA: NEGATIVE

## 2019-10-04 LAB — CULTURE, BETA STREP (GROUP B ONLY): Strep Gp B Culture: POSITIVE — AB

## 2019-10-06 DIAGNOSIS — B951 Streptococcus, group B, as the cause of diseases classified elsewhere: Secondary | ICD-10-CM | POA: Insufficient documentation

## 2019-10-08 ENCOUNTER — Encounter: Payer: Self-pay | Admitting: Family Medicine

## 2019-10-08 ENCOUNTER — Other Ambulatory Visit: Payer: Self-pay

## 2019-10-08 ENCOUNTER — Ambulatory Visit: Payer: Self-pay | Admitting: Family Medicine

## 2019-10-08 VITALS — BP 105/69 | HR 96 | Temp 98.2°F | Wt 162.0 lb

## 2019-10-08 DIAGNOSIS — B951 Streptococcus, group B, as the cause of diseases classified elsewhere: Secondary | ICD-10-CM

## 2019-10-08 DIAGNOSIS — Z3480 Encounter for supervision of other normal pregnancy, unspecified trimester: Secondary | ICD-10-CM

## 2019-10-08 NOTE — Progress Notes (Signed)
In for visit; denies hospital visits since last appt; discussed +GBS culture & info sheet given Sharlette Dense, RN

## 2019-10-08 NOTE — Progress Notes (Signed)
   PRENATAL VISIT NOTE  Subjective:  Donna Barrett is a 34 y.o. [phone] at [redacted]w[redacted]d being seen today for ongoing prenatal care.  She is currently monitored for the following issues for this low-risk pregnancy and has Supervision of other normal pregnancy, antepartum; History of postpartum hemorrhage, currently pregnant; Pre-syncope; Abnormal 1 hr GTT, 3 hr GTT wnl 08/08/19; and Positive GBS test on their problem list.  Patient reports no complaints.  Contractions: Irregular.  .  Movement: Present. Denies leaking of fluid/ROM.   The following portions of the patient's history were reviewed and updated as appropriate: allergies, current medications, past family history, past medical history, past social history, past surgical history and problem list. Problem list updated.  Objective:   Vitals:   10/08/19 1357  BP: 105/69  Pulse: 96  Temp: 98.2 F (36.8 C)  Weight: 162 lb (73.5 kg)    Fetal Status: Fetal Heart Rate (bpm): 150 Fundal Height: 39 cm Movement: Present  Presentation: Vertex  General:  Alert, oriented and cooperative. Patient is in no acute distress.  Skin: Skin is warm and dry. No rash noted.   Cardiovascular: Normal heart rate noted  Respiratory: Normal respiratory effort, no problems with respiration noted  Abdomen: Soft, gravid, appropriate for gestational age.  Pain/Pressure: Absent     Pelvic: Cervical exam deferred        Extremities: Normal range of motion.  Edema: Trace  Mental Status: Normal mood and affect. Normal behavior. Normal judgment and thought content.   Assessment and Plan:  Pregnancy: [id number] at [redacted]w[redacted]d   1. Supervision of other normal pregnancy, antepartum -Up to date. She had irregular contractions Saturday, none today. Discussed when to go to hospital, ready for baby at home.   2. Positive GBS test -Discussed results and need for pre/intrapartum abx and to let hospital staff know of status.    Term labor symptoms and general obstetric  precautions including but not limited to vaginal bleeding, contractions, leaking of fluid and fetal movement were reviewed in detail with the patient. Please refer to After Visit Summary for other counseling recommendations.  Return in about 1 week (around 10/15/2019) for routine prenatal care.  Future Appointments  Date Time Provider Department Center  10/15/2019  3:00 PM [facility] PROVIDER [facility] None    Ann Held, PA-C

## 2019-10-15 ENCOUNTER — Ambulatory Visit: Payer: Self-pay | Admitting: Family Medicine

## 2019-10-15 ENCOUNTER — Encounter: Payer: Self-pay | Admitting: Family Medicine

## 2019-10-15 ENCOUNTER — Other Ambulatory Visit: Payer: Self-pay

## 2019-10-15 VITALS — BP 114/79 | HR 107 | Temp 97.3°F | Wt 162.4 lb

## 2019-10-15 DIAGNOSIS — B951 Streptococcus, group B, as the cause of diseases classified elsewhere: Secondary | ICD-10-CM

## 2019-10-15 DIAGNOSIS — O09299 Supervision of pregnancy with other poor reproductive or obstetric history, unspecified trimester: Secondary | ICD-10-CM

## 2019-10-15 DIAGNOSIS — Z3480 Encounter for supervision of other normal pregnancy, unspecified trimester: Secondary | ICD-10-CM

## 2019-10-15 NOTE — Progress Notes (Signed)
Per client, seen earlier today at [facility] for contractions. States was discharged and to return when contractions strengthen. States was dilated 3. Reports fetal movement. Denies leakage of fluid. Reports while in bathroom at [facility] today, passed some whitish material with small amt blood streaks. Reviewed timing of contractions with client and partner. Jossie Ng, RN

## 2019-10-15 NOTE — Progress Notes (Signed)
  PRENATAL VISIT NOTE  Subjective:  Donna Barrett is a 34 y.o. [phone] at [redacted]w[redacted]d being seen today for ongoing prenatal care.  She is currently monitored for the following issues for this low-risk pregnancy and has Supervision of other normal pregnancy, antepartum; History of postpartum hemorrhage, currently pregnant; Pre-syncope; Abnormal 1 hr GTT, 3 hr GTT wnl 08/08/19; and Positive GBS test on their problem list.  Patient reports she was seen at [facility] earlier today for contractions 5 min apart, was checked and told she remained at 3cm, ok to go home for awhile. She presents now for her regularly scheduled prenatal visit. States contractions are somewhat more intense than before, still coming 5 min apart.   Contractions: Regular. Vag. Bleeding: None.  Movement: Present. Denies leaking of fluid/ROM.   The following portions of the patient's history were reviewed and updated as appropriate: allergies, current medications, past family history, past medical history, past social history, past surgical history and problem list. Problem list updated.  Objective:   Vitals:   10/15/19 1502  BP: 114/79  Pulse: (!) 107  Temp: (!) 97.3 F (36.3 C)  Weight: 162 lb 6.4 oz (73.7 kg)    Fetal Status: Fetal Heart Rate (bpm): 150 Fundal Height: 39 cm Movement: Present  Presentation: Vertex  General:  Alert, oriented and cooperative. Patient is having intermittent contractions during visit, at times with moderate distress but converses well.  Skin: Skin is warm and dry. No rash noted.   Cardiovascular: Normal heart rate noted  Respiratory: Normal respiratory effort, no problems with respiration noted  Abdomen: Gravid, appropriate for gestational age.  Pain/Pressure: Present     Pelvic: Cervical exam performed Sterile cervical exam: Dilation: 3.5 Effacement (%): 60 Station: -2  Extremities: Normal range of motion.  Edema: None  Mental Status: Normal mood and affect. Normal behavior. Normal judgment  and thought content.   Assessment and Plan:  Pregnancy: [id number] at [redacted]w[redacted]d    1. Supervision of other normal pregnancy, antepartum -Here for scheduled prenatal visit though she appears to be in early labor. Contractions present x several hours, continue ~5 minutes apart, getting somewhat more intense. We discussed ER vs labor at home a little longer. As she is still 3.5/60/-2 she and partner have decided to go home for now but likely to ER soon. Advised to eat, take a warm bath and walk. Advised to go to L&D when she desires, if contractions become stronger or if any closer together.  2. Positive GBS test -Needs intrapartum abx  3. History of postpartum hemorrhage, currently pregnant    Term labor symptoms and general obstetric precautions including but not limited to vaginal bleeding, contractions, leaking of fluid and fetal movement were reviewed in detail with the patient. Please refer to After Visit Summary for other counseling recommendations.  Return in about 1 week (around 10/22/2019) for routine prenatal care.  Future Appointments  Date Time Provider Department Center  10/22/2019  2:00 PM [facility] PROVIDER [facility] None    Ann Held, PA-C

## 2019-10-22 ENCOUNTER — Ambulatory Visit: Payer: Self-pay

## 2019-10-23 MED ORDER — DIPHENHYDRAMINE HCL 25 MG PO CAPS
25.00 | ORAL_CAPSULE | ORAL | Status: DC
Start: ? — End: 2019-10-23

## 2019-10-23 MED ORDER — CALCIUM CARBONATE 1250 (500 CA) MG PO CHEW
CHEWABLE_TABLET | ORAL | Status: DC
Start: ? — End: 2019-10-23

## 2019-10-23 MED ORDER — ONDANSETRON 4 MG PO TBDP
4.00 | ORAL_TABLET | ORAL | Status: DC
Start: ? — End: 2019-10-23

## 2019-10-23 MED ORDER — PNV PRENATAL PLUS MULTIVITAMIN 27-1 MG PO TABS
1.00 | ORAL_TABLET | ORAL | Status: DC
Start: 2019-10-23 — End: 2019-10-23

## 2019-10-23 MED ORDER — ACETAMINOPHEN 325 MG PO TABS
650.00 | ORAL_TABLET | ORAL | Status: DC
Start: ? — End: 2019-10-23

## 2019-10-23 MED ORDER — IBUPROFEN 600 MG PO TABS
600.00 | ORAL_TABLET | ORAL | Status: DC
Start: 2019-10-22 — End: 2019-10-23

## 2019-10-23 MED ORDER — DSS 100 MG PO CAPS
100.00 | ORAL_CAPSULE | ORAL | Status: DC
Start: 2019-10-22 — End: 2019-10-23

## 2019-11-03 ENCOUNTER — Telehealth: Payer: Self-pay | Admitting: Licensed Clinical Social Worker

## 2019-11-03 NOTE — Telephone Encounter (Signed)
LCSW used language line [phone] to conduct two week postpartum mood check. Conducted brief mood and anxiety assessment, patient denies any symptoms of depression or anxiety. She reports that her appetite is good and that she is able to sleep well when baby is sleeping. She also reports a good support system and denies any other concerns. Patient did report that she is not breastfeeding due to lack of milk supply. Although she did not voice any concerns regarding her inability to breastfeed LCSW provided information about breastfeeding support through [facility] and gave patient the number to [facility]. LCSW encouraged patient to call her and provided contact information, if she has any concerns regarding mood or anxiety issues in the future.

## 2019-11-03 NOTE — Telephone Encounter (Signed)
-----   Message from Kathreen Cosier, [place] sent at 10/30/2019 12:38 PM EDT ----- Regarding: delivered 3/22

## 2019-12-01 ENCOUNTER — Other Ambulatory Visit: Payer: Self-pay

## 2019-12-01 ENCOUNTER — Encounter: Payer: Self-pay | Admitting: Advanced Practice Midwife

## 2019-12-01 ENCOUNTER — Ambulatory Visit: Payer: Self-pay | Admitting: Advanced Practice Midwife

## 2019-12-01 VITALS — BP 111/78 | Wt 139.2 lb

## 2019-12-01 DIAGNOSIS — Z3009 Encounter for other general counseling and advice on contraception: Secondary | ICD-10-CM

## 2019-12-01 DIAGNOSIS — R87619 Unspecified abnormal cytological findings in specimens from cervix uteri: Secondary | ICD-10-CM | POA: Insufficient documentation

## 2019-12-01 DIAGNOSIS — E663 Overweight: Secondary | ICD-10-CM

## 2019-12-01 DIAGNOSIS — Z3042 Encounter for surveillance of injectable contraceptive: Secondary | ICD-10-CM

## 2019-12-01 DIAGNOSIS — R87612 Low grade squamous intraepithelial lesion on cytologic smear of cervix (LGSIL): Secondary | ICD-10-CM

## 2019-12-01 DIAGNOSIS — Z8659 Personal history of other mental and behavioral disorders: Secondary | ICD-10-CM | POA: Insufficient documentation

## 2019-12-01 DIAGNOSIS — Z30013 Encounter for initial prescription of injectable contraceptive: Secondary | ICD-10-CM

## 2019-12-01 MED ORDER — MEDROXYPROGESTERONE ACETATE 150 MG/ML IM SUSP
150.0000 mg | Freq: Once | INTRAMUSCULAR | Status: AC
  Administered 2019-12-01: 17:00:00 150 mg via INTRAMUSCULAR

## 2019-12-01 MED ORDER — THERA VITAL M PO TABS: 1.0000 | ORAL_TABLET | Freq: Every day | ORAL | 0 refills | Status: AC

## 2019-12-01 NOTE — Progress Notes (Signed)
Patient here for PP exam. SVD 10/20/2019, still taking PNV. Planning to use condoms for [facility]. Needs Hgb today.Burt Knack, RN

## 2019-12-01 NOTE — Progress Notes (Signed)
Post Partum Exam  Donna Barrett is a 34 y.o. G4P3 nonsmoking [facility] female who presents for a postpartum visit. SROM with SVD on 10/20/19 F 7#9.  Bottlefeeding q 3 hrs (3 oz) and baby weighed 8#15 2 weeks ago.  Living with husband and 3 children; husband helps with kids.  She is 6 weeks postpartum following a spontaneous vaginal delivery. I have fully reviewed the prenatal and intrapartum course. The delivery was at 18 1/7 gestational weeks.  Anesthesia: IV sedation. Postpartum course has been wnl. Baby's course has been wnl. Baby is feeding by Bottle Bleeding no bleeding. Bowel function is normal. Bladder function is normal. Patient is not sexually active. Contraception method is abstinence.   Postpartum depression screening:  0  Last pap smear done 10/21/07 and was Abnormal- LSIL, suspicious for HSIL.  ROI sent to Princella Ion at [facility] but no response  Review of Systems Pertinent items are noted in HPI.    Objective:  BP 111/78   Wt 139 lb 3.2 oz (63.1 kg)   LMP 01/19/2019 (Approximate) Comment: normal  Breastfeeding No Comment: no period since delivery  BMI 25.46 kg/m   Gen: well appearing, NAD HEENT: no scleral icterus CV: RR Lung: Normal WOB Breast:performed-not indicated  Ext: warm well perfused  GU: abdomen--soft, poor tone, c/o discomfort left mid quadrant since got home from hospital Rectal: performed -  not indicated       Assessment:    6 wk postpartum exam. Pap smear done at today's visit.   Plan:   Essential components of care per ACOG recommendations for Comprehensive Postpartum exam:  1.  Mood and well being: Patient with negative depression screening today. Reviewed local resources for support. EPDS is low risk. Reviewed resources and that mood sx in first year after pregnancy are considered related to pregnancy and to reach out for help at [facility] if needed. Discussed [facility] as link to care and availability of LCSW for counseling  - Patient does not use  tobacco. If using tobacco we discussed reduction and for recently cessation risk of relapse - hx of drug use? No     2. Infant care and feeding:  -Patient currently breastmilk feeding? No If breastmilk feeding discussed return to work and pumping. If needed, patient was provided letter for work to allow for every 2-3 hr pumping breaks, and to be granted a private location to express breastmilk and refrigerated area to store breastmilk. Reviewed importance of draining breast regularly to support lactation. I  -Recommended patient engage with WIC/BFpeer counselors  -Counseled to sign new child up for [facility] services -Social determinants of health (SDOH) reviewed in EPIC. No concernsThe following needs were identified  3. Sexuality, contraception and birth spacing  Contraception: Contraception counseling: Reviewed all forms of birth control options in the tiered based approach. available including abstinence; over the counter/barrier methods; hormonal contraceptive medication including pill, patch, ring, injection,contraceptive implant; hormonal and nonhormonal IUDs; permanent sterilization options including vasectomy and the various tubal sterilization modalities. Risks, benefits, and typical effectiveness rates were reviewed.  Questions were answered.  Written information was also given to the patient to review.  Patient desires DMPA, this was prescribed for patient. She will follow up in  12 wks for surveillance.  She was told to call with any further questions, or with any concerns about this method of contraception.  Emphasized use of condoms 100% of the time for STI prevention.  Patient was offered ECP. ECP was not accepted by the patient. ECP  counseling was not given - see RN documentation  - Patient does not want a pregnancy in the next year.  Desired family size is 3 children.  - Reviewed forms of contraception in tiered fashion. Patient desired Depo-Provera today.   - Discussed birth spacing of  18 months  4. Sleep and fatigue -Encouraged family/partner/community support of 4 hrs of uninterrupted sleep to help with mood and fatigue  5. Physical Recovery  - Discussed patients delivery and complications - Patient had a first degree laceration, perineal healing reviewed. Patient expressed understanding - Patient has urinary incontinence? No Patient was referred to pelvic floor PT--no  - Patient is safe to resume physical and sexual activity  6.  Health Maintenance/Chronic Disease - Last pap smear performed 10/21/07 and was abnormal with LSIL suspicious for HSIL with negative HPV. Mammogram n/a  1. Overweight BMI=25.4   2. Postpartum exam Please give pt primary care list DMPA 150 mg IM q 11-13 wks x 1 year Please counsel on need for abstinance/back up condoms next 7 days - Hemoglobin, venipuncture  3. Low grade squamous intraepithelial lesion on cytologic smear of cervix (LGSIL) 10/21/07 ROI was requested from Phineas Real at [facility] and no response.  Pap done today  - IGP, Aptima HPV   Patient given handout about PCP care in the community Given MVI per family planning program guidelines and availability  Follow up in: 11-13 weeks weeks or as needed.

## 2019-12-01 NOTE — Progress Notes (Signed)
Depo consent signed, depo given right deltoid, next Depo card given, condoms given and patient counseled to use with all sex for 1 week.Burt Knack, RN

## 2019-12-02 LAB — HEMOGLOBIN, FINGERSTICK: Hemoglobin: 14.3 g/dL (ref 11.1–15.9)

## 2019-12-04 LAB — IGP, APTIMA HPV
HPV Aptima: NEGATIVE
PAP Smear Comment: 0

## 2020-11-11 IMAGING — MG DIGITAL DIAGNOSTIC BILATERAL MAMMOGRAM WITH TOMO AND CAD
8 of 14 series · 8 of 40 positions shown · non-contrast
Comparison: Baseline mammogram.

CLINICAL DATA: Bilateral yellow nonspontaneous nipple discharge.

EXAM:
DIGITAL DIAGNOSTIC BILATERAL MAMMOGRAM WITH CAD AND TOMO
ULTRASOUND BILATERAL BREAST

[L MLO synth-2D (1 of 2)]
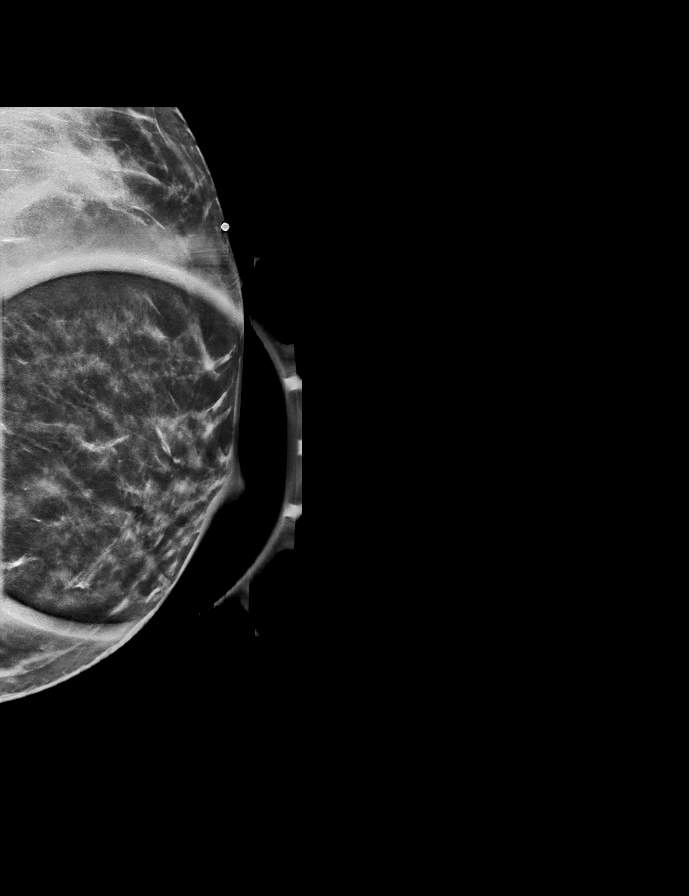

[L MLO synth-2D (2 of 2)]
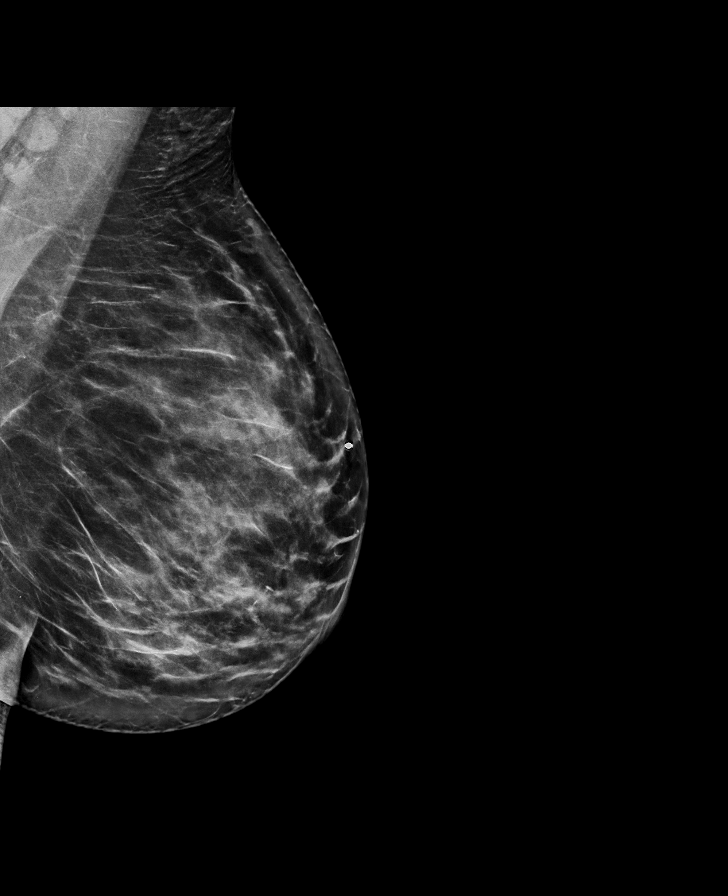

[R MLO synth-2D (1 of 2)]
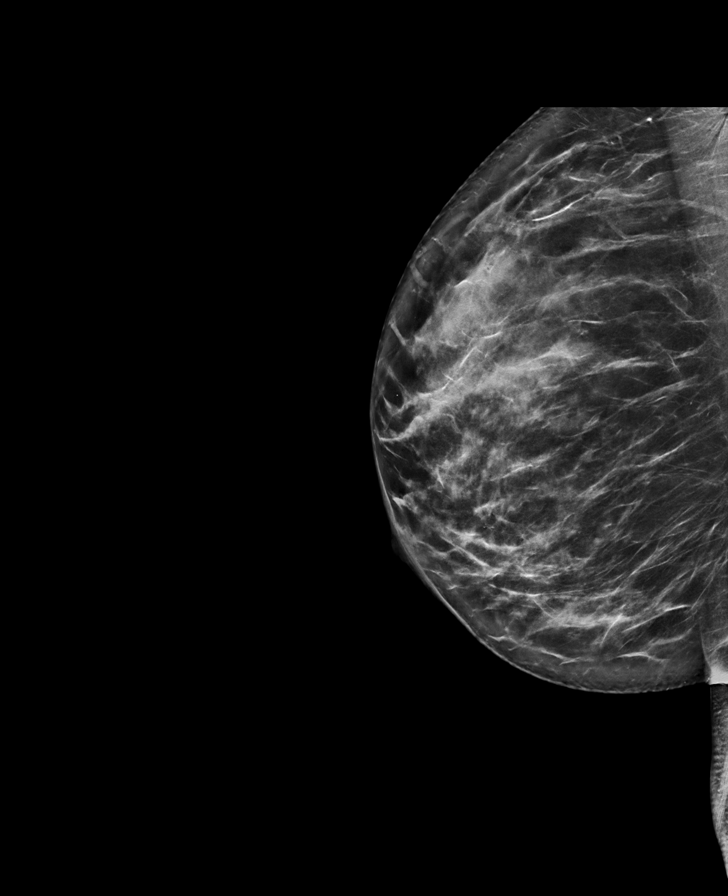

[R CC synth-2D]
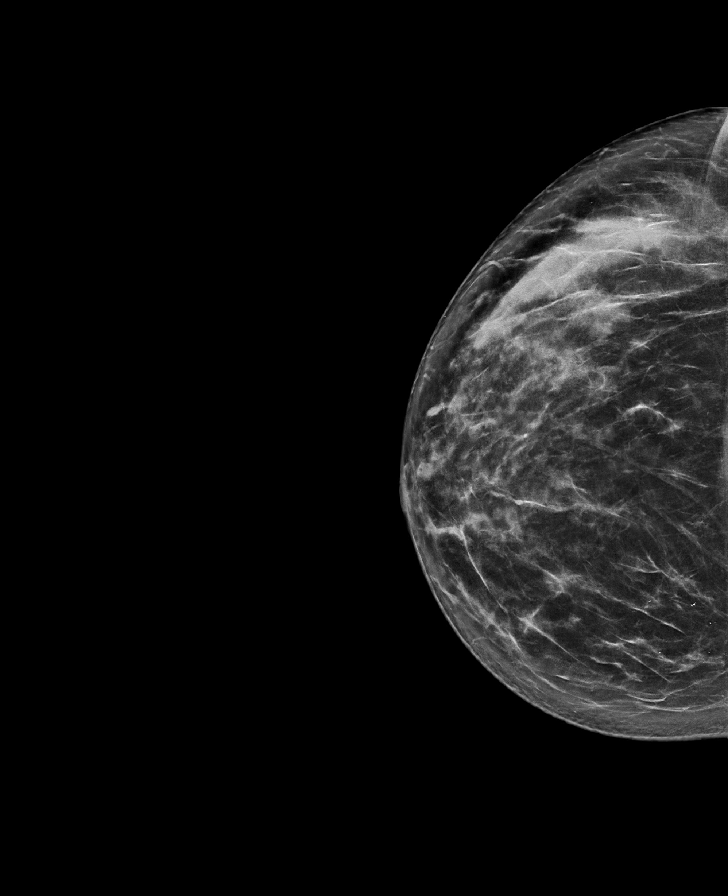

[L CC synth-2D]
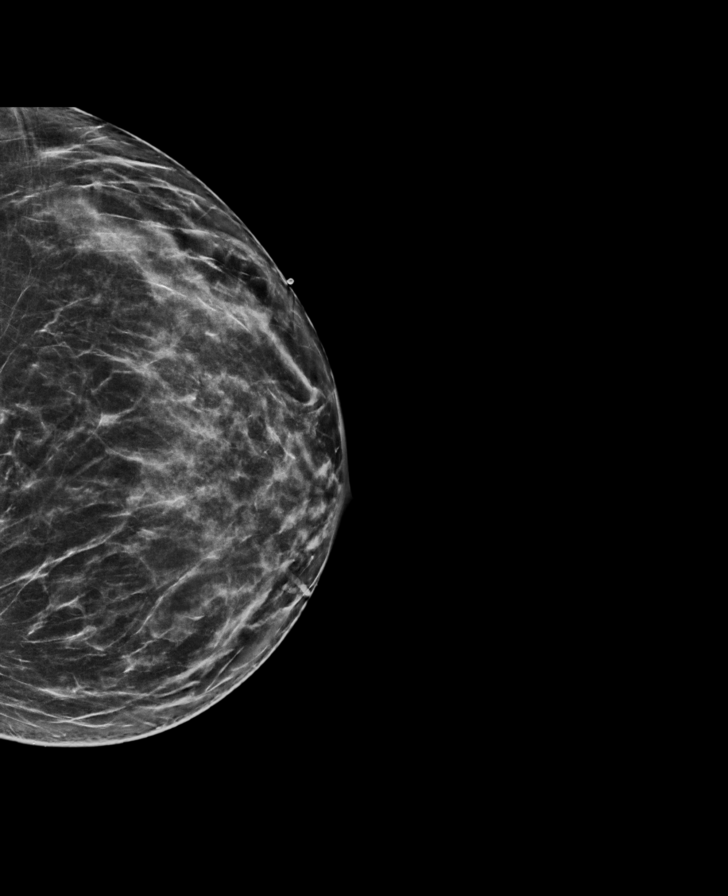

[L TAN synth-2D]
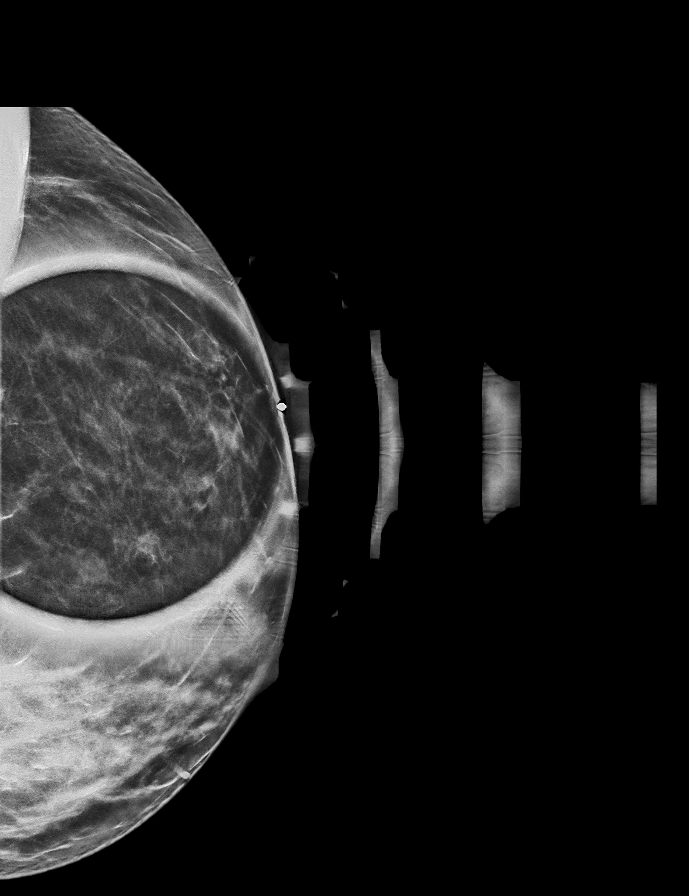

[R MLO synth-2D (2 of 2)]
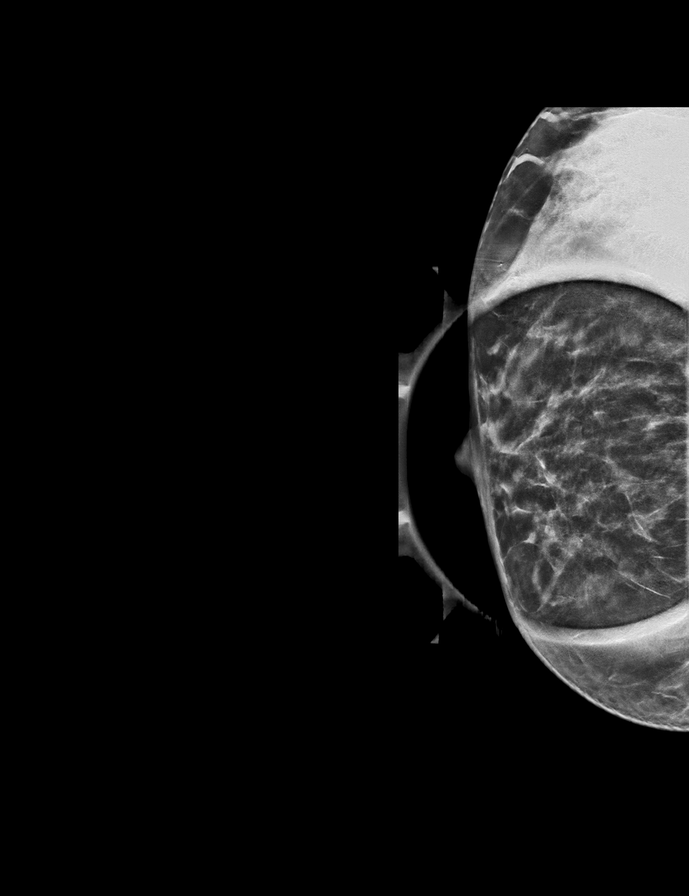

[L MLO tomo · tomo slice 31/61.0]
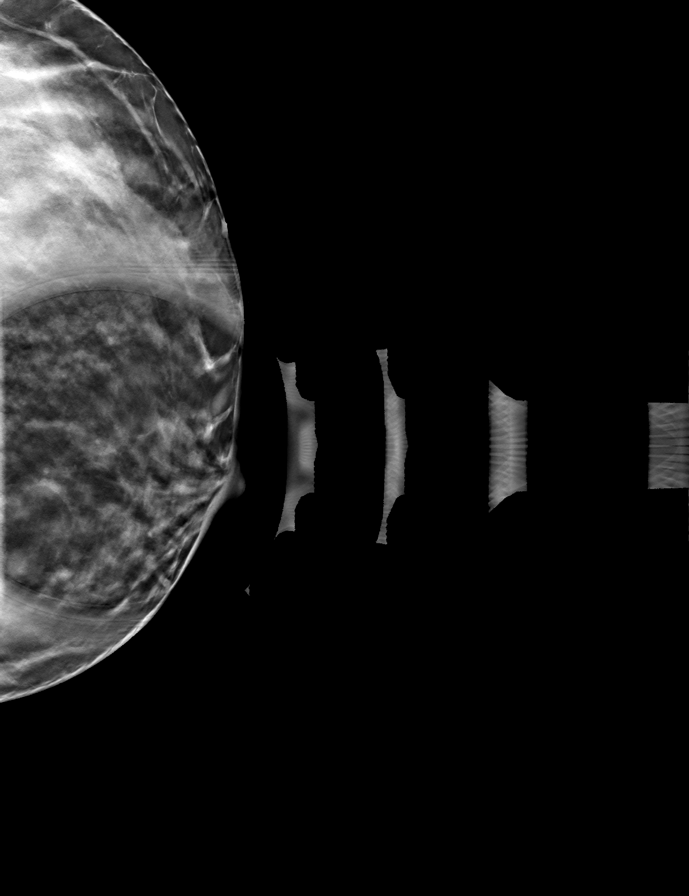

[8 of 40 positions shown; findings below may reference images not displayed]

ACR Breast Density Category c: The breast tissue is heterogeneously
dense, which may obscure small masses.
FINDINGS: Mammographically, there are no suspicious masses, areas of
architectural distortion or microcalcifications in either breast.

Mammographic images were processed with CAD.

Targeted bilateral retroareolar ultrasound is performed, showing no
suspicious masses or shadowing lesions bilaterally. No evidence of
duct ectasia.
IMPRESSION: No mammographic sonographic evidence of malignancy in either breast.

RECOMMENDATION:
Further management of patient's bilateral nonspontaneous nipple
discharge should be based on clinical grounds.

Otherwise, recommend screening mammogram at age 40 unless there are
persistent or intervening clinical concerns. (Code:N7-3-4PV)

I have discussed the findings and recommendations with the patient.
Results were also provided in writing at the conclusion of the
visit. If applicable, a reminder letter will be sent to the patient
regarding the next appointment.

BI-RADS CATEGORY  1: Negative.
